# Patient Record
Sex: Male | Born: 1935 | Race: White | Hispanic: No | Smoking: Former smoker
Health system: Southern US, Community
[De-identification: ages and names within clinical notes are randomized; demographics above are authoritative.]

## PROBLEM LIST (undated history)

## (undated) DIAGNOSIS — I10 Essential (primary) hypertension: Secondary | ICD-10-CM

## (undated) DIAGNOSIS — Z950 Presence of cardiac pacemaker: Secondary | ICD-10-CM

## (undated) DIAGNOSIS — C61 Malignant neoplasm of prostate: Secondary | ICD-10-CM

## (undated) DIAGNOSIS — N189 Chronic kidney disease, unspecified: Secondary | ICD-10-CM

## (undated) DIAGNOSIS — Z978 Presence of other specified devices: Secondary | ICD-10-CM

## (undated) DIAGNOSIS — N400 Enlarged prostate without lower urinary tract symptoms: Secondary | ICD-10-CM

## (undated) DIAGNOSIS — C801 Malignant (primary) neoplasm, unspecified: Secondary | ICD-10-CM

## (undated) HISTORY — PX: COLONOSCOPY: SHX174

## (undated) HISTORY — DX: Malignant neoplasm of prostate: C61

## (undated) HISTORY — PX: PACEMAKER IMPLANT: EP1218

## (undated) HISTORY — PX: HERNIA REPAIR: SHX51

## (undated) HISTORY — DX: Chronic kidney disease, unspecified: N18.9

## (undated) HISTORY — DX: Essential (primary) hypertension: I10

## (undated) HISTORY — PX: APPENDECTOMY: SHX54

## (undated) HISTORY — DX: Benign prostatic hyperplasia without lower urinary tract symptoms: N40.0

---

## 2013-03-27 DIAGNOSIS — R079 Chest pain, unspecified: Secondary | ICD-10-CM

## 2013-03-28 DIAGNOSIS — R079 Chest pain, unspecified: Secondary | ICD-10-CM

## 2013-03-28 DIAGNOSIS — I498 Other specified cardiac arrhythmias: Secondary | ICD-10-CM

## 2013-04-24 ENCOUNTER — Encounter: Payer: Self-pay | Admitting: Cardiology

## 2013-04-25 ENCOUNTER — Encounter: Payer: Medicare Other | Admitting: Cardiovascular Disease

## 2013-05-09 ENCOUNTER — Encounter: Payer: Self-pay | Admitting: Cardiovascular Disease

## 2013-05-09 ENCOUNTER — Ambulatory Visit (INDEPENDENT_AMBULATORY_CARE_PROVIDER_SITE_OTHER): Payer: Medicare Other | Admitting: Cardiovascular Disease

## 2013-05-09 VITALS — BP 164/73 | HR 56 | Ht 63.0 in | Wt 154.0 lb

## 2013-05-09 DIAGNOSIS — I10 Essential (primary) hypertension: Secondary | ICD-10-CM

## 2013-05-09 DIAGNOSIS — R079 Chest pain, unspecified: Secondary | ICD-10-CM

## 2013-05-09 DIAGNOSIS — I441 Atrioventricular block, second degree: Secondary | ICD-10-CM

## 2013-05-09 NOTE — Patient Instructions (Signed)
Continue all current medications. Your physician wants you to follow up in:  4 months.  You will receive a reminder letter in the mail one-two months in advance.  If you don't receive a letter, please call our office to schedule the follow up appointment   

## 2013-05-09 NOTE — Progress Notes (Signed)
Patient ID: Austin Jacobs, male   DOB: 17-Apr-1917, 79 y.o.   MRN: [id number]      SUBJECTIVE: Mr. Medcalf is here for f/u after a hospitalization for chest pain at [facility] where I evaluated him. He has known HTN, CKD, and BPH. 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  He denies chest pain, shortness of breath, leg swelling, palpitations, and dizziness. He recently came back after traveling nearly 3000 miles throughout the [place] and [place].  He mows his own lawn with a push mower.    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 He started the [employer] in [place]. Lived in [place] ([place] for nearly 60 years. Served in [place] in [place]. Brother was killed on [address] in [place] (06-30-1940). Has a girlfriend named Armed forces operational officer.    No Known Allergies  Current Outpatient Prescriptions  Medication Sig Dispense Refill  . aliskiren (TEKTURNA) 150 MG tablet Take 150 mg by mouth daily.      Marland Kitchen aspirin 81 MG tablet Take 81 mg by mouth daily.      . hydrochlorothiazide (MICROZIDE) 12.5 MG capsule Take 12.5 mg by mouth daily.      . Multiple Vitamins-Minerals (CENTRUM SILVER PO) Take 1 tablet by mouth daily.      . polyethylene glycol powder (GLYCOLAX/MIRALAX) powder Take 17 g by mouth daily as needed.      . tamsulosin (FLOMAX) 0.4 MG CAPS capsule Take 0.4 mg by mouth daily after supper.       No current facility-administered medications for this visit.    Past Medical History  Diagnosis Date  . Hypertension   . Chronic kidney disease   . BPH (benign prostatic hyperplasia)     Past Surgical History  Procedure Laterality Date  . Appendectomy    . Colonoscopy    . Hernia repair      History   Social History  . Marital Status: Widowed    Spouse Name: N/A    Number of Children: N/A  . Years of Education: N/A   Occupational History  . Not on file.    Social History Main Topics  . Smoking status: Former Smoker -- 0.50 packs/day for 22 years    Types: Cigarettes, Pipe    Start date: 07/26/1935    Quit date: 07/25/1957  . Smokeless tobacco: Never Used  . Alcohol Use: Not on file  . Drug Use: Not on file  . Sexual Activity: Not on file   Other Topics Concern  . Not on file   Social History Narrative  . No narrative on file     Filed Vitals:   05/09/13 1308 05/09/13 1314  BP: 171/70 164/73  Pulse: 64 56  Height: 5\' 3"  (1.6 m)   Weight: 154 lb (69.854 kg)     PHYSICAL EXAM General: NAD Neck: No JVD, no thyromegaly or thyroid nodule.  Lungs: Clear to auscultation bilaterally with normal respiratory effort. CV: Nondisplaced PMI.  Heart regular S1/S2, no S3/S4, no murmur.  No peripheral edema.  No carotid bruit.  Normal pedal pulses.  Abdomen: Soft, nontender, no hepatosplenomegaly, no distention.  Neurologic: Alert and oriented x 3.  Psych: Normal affect. Extremities: No clubbing or cyanosis.   ECG: reviewed and available in electronic records.      ASSESSMENT AND PLAN: 1. Chest pain: no further recurrences. No indication for  noninvasive testing. 2. HTN: he checks his BP at home and tells me his SBP normally runs in the 130-140 mmHg range. He is on Tekturna and HCTZ. 3. Conduction system disease: has bifascicular block and a h/o Mobitz I block while hospitalized. Continue to monitor.   Kate Sable, M.D., F.A.C.C.

## 2013-09-05 ENCOUNTER — Encounter: Payer: Self-pay | Admitting: Cardiovascular Disease

## 2013-09-05 ENCOUNTER — Ambulatory Visit (INDEPENDENT_AMBULATORY_CARE_PROVIDER_SITE_OTHER): Payer: Medicare Other | Admitting: Cardiovascular Disease

## 2013-09-05 VITALS — BP 146/72 | HR 62 | Ht 63.0 in | Wt 157.0 lb

## 2013-09-05 DIAGNOSIS — I441 Atrioventricular block, second degree: Secondary | ICD-10-CM

## 2013-09-05 DIAGNOSIS — R079 Chest pain, unspecified: Secondary | ICD-10-CM

## 2013-09-05 DIAGNOSIS — I10 Essential (primary) hypertension: Secondary | ICD-10-CM

## 2013-09-05 NOTE — Progress Notes (Signed)
Patient ID: IBAAD KHOURY, male   DOB: Jun 17, 1917, 80 y.o.   MRN: [id number]      SUBJECTIVE: Mr. Clemenson is here for routine cardiovascular follow-up. He has known HTN, CKD, and BPH.  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  He denies chest pain, shortness of breath, leg swelling, palpitations, and dizziness.  He visited his son in [place], [place], over Christmas. He and his girlfriend, [name], are planning to go to [place] at the end of this month. He plays cards once a month. He enjoys reading the encyclopedia.  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 He started the [employer] in [place]. Lived in [place] ([place]) for nearly 60 years. Served in [place] in [place]. Brother was killed [place] [address] in [place] (06-30-1940). He worked under [employer] and then retired on 07-25-1976 when [employer] was president.      No Known Allergies  Current Outpatient Prescriptions  Medication Sig Dispense Refill  . aliskiren (TEKTURNA) 150 MG tablet Take 150 mg by mouth daily.      Marland Kitchen amoxicillin (AMOXIL) 500 MG capsule Take 1 capsule by mouth as directed. Prior to dental procedures      . aspirin 81 MG tablet Take 81 mg by mouth daily.      . hydrochlorothiazide (MICROZIDE) 12.5 MG capsule Take 12.5 mg by mouth daily.      . Multiple Vitamins-Minerals (CENTRUM SILVER PO) Take 1 tablet by mouth daily.      . polyethylene glycol powder (GLYCOLAX/MIRALAX) powder Take 17 g by mouth daily as needed.      . tamsulosin (FLOMAX) 0.4 MG CAPS capsule Take 0.4 mg by mouth daily after supper.       No current facility-administered medications for this visit.    Past Medical History  Diagnosis Date  . Hypertension   . Chronic kidney disease   . BPH (benign prostatic hyperplasia)     Past Surgical History  Procedure Laterality Date  . Appendectomy    . Colonoscopy    . Hernia repair       History   Social History  . Marital Status: Widowed    Spouse Name: N/A    Number of Children: N/A  . Years of Education: N/A   Occupational History  . Not on file.   Social History Main Topics  . Smoking status: Former Smoker -- 0.50 packs/day for 22 years    Types: Cigarettes, Pipe    Start date: 07/26/1935    Quit date: 07/25/1957  . Smokeless tobacco: Never Used  . Alcohol Use: Not on file  . Drug Use: Not on file  . Sexual Activity: Not on file   Other Topics Concern  . Not on file   Social History Narrative  . No narrative on file     Filed Vitals:   09/05/13 1002  BP: 146/72  Pulse: 62  Height: 5\' 3"  (1.6 m)  Weight: 157 lb (71.215 kg)  SpO2: 100%    PHYSICAL EXAM General: NAD Neck: No JVD, no thyromegaly or thyroid nodule.  Lungs: Clear to auscultation bilaterally with normal respiratory effort. CV: Nondisplaced PMI.  Heart regular S1/S2, no S3/S4, II/VI ejection systolic murmur over RUSB.  No peripheral edema.  No carotid bruit.  Normal pedal pulses.  Abdomen: Soft, nontender, no hepatosplenomegaly, no distention.  Neurologic: Alert and oriented x 3.  Psych: Normal  affect. Extremities: No clubbing or cyanosis.   ECG: reviewed and available in electronic records.      ASSESSMENT AND PLAN: 1. Chest pain: no further recurrences. No indication for noninvasive testing.  2. HTN: well controlled on current therapy which includes Tekturna and HCTZ.  3. Conduction system disease: has bifascicular block and a h/o Mobitz I block while hospitalized. Continue to monitor.  Dispo: f/u 4 months.  Kate Sable, M.D., F.A.C.C.

## 2013-09-05 NOTE — Patient Instructions (Signed)
Your physician recommends that you schedule a follow-up appointment in: 4 months with Dr. Bronson Ing. This appointment will be scheduled today before you leave.  Your physician recommends that you continue on your current medications as directed. Please refer to the Current Medication list given to you today.

## 2013-12-08 ENCOUNTER — Encounter: Payer: Self-pay | Admitting: Cardiovascular Disease

## 2014-01-03 ENCOUNTER — Encounter: Payer: Self-pay | Admitting: Cardiovascular Disease

## 2014-01-03 ENCOUNTER — Ambulatory Visit (INDEPENDENT_AMBULATORY_CARE_PROVIDER_SITE_OTHER): Payer: Medicare Other | Admitting: Cardiovascular Disease

## 2014-01-03 VITALS — BP 131/72 | HR 74 | Ht 63.0 in | Wt 149.0 lb

## 2014-01-03 DIAGNOSIS — I441 Atrioventricular block, second degree: Secondary | ICD-10-CM

## 2014-01-03 DIAGNOSIS — I10 Essential (primary) hypertension: Secondary | ICD-10-CM

## 2014-01-03 DIAGNOSIS — R079 Chest pain, unspecified: Secondary | ICD-10-CM

## 2014-01-03 NOTE — Progress Notes (Signed)
Patient ID: Austin Jacobs, male   DOB: 26-May-1917, 80 y.o.   MRN: [id number]      SUBJECTIVE: Austin Jacobs is here for routine cardiovascular follow-up. He has known HTN, CKD, and BPH.  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   He denies chest pain, shortness of breath, leg swelling, and palpitations. He may get dizzy if he stands up too quickly. He is planning on going with Suzie to [place] to attend a dinner theater next week. He walks in the mall for 30 minutes a day 5 days a week.  He plays cards once a month. He enjoys reading the encyclopedia.    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 He started the [employer] in [place]. Lived in [place] ([place] for nearly 60 years. Served in [place] in [place]. Brother was killed [place] [address] in [place] (06-30-1940). He worked under [employer] and then retired on 07-25-1976 when E[employer] was president.     No Known Allergies  Current Outpatient Prescriptions  Medication Sig Dispense Refill  . aliskiren (TEKTURNA) 150 MG tablet Take 150 mg by mouth daily.      Marland Kitchen amoxicillin (AMOXIL) 500 MG capsule Take 1 capsule by mouth as directed. Prior to dental procedures      . aspirin 81 MG tablet Take 81 mg by mouth daily.      . hydrochlorothiazide (HYDRODIURIL) 12.5 MG tablet Take 1 tablet by mouth daily.      . Multiple Vitamins-Minerals (CENTRUM SILVER PO) Take 1 tablet by mouth daily.      . polyethylene glycol powder (GLYCOLAX/MIRALAX) powder Take 17 g by mouth daily as needed.      . tamsulosin (FLOMAX) 0.4 MG CAPS capsule Take 0.4 mg by mouth daily after supper.       No current facility-administered medications for this visit.    Past Medical History  Diagnosis Date  . Hypertension   . Chronic kidney disease   . BPH (benign prostatic hyperplasia)     Past Surgical History  Procedure Laterality Date  . Appendectomy    .  Colonoscopy    . Hernia repair      History   Social History  . Marital Status: Widowed    Spouse Name: N/A    Number of Children: N/A  . Years of Education: N/A   Occupational History  . Not on file.   Social History Main Topics  . Smoking status: Former Smoker -- 0.50 packs/day for 22 years    Types: Cigarettes, Pipe    Start date: 07/26/1935    Quit date: 07/25/1957  . Smokeless tobacco: Never Used  . Alcohol Use: Not on file  . Drug Use: Not on file  . Sexual Activity: Not on file   Other Topics Concern  . Not on file   Social History Narrative  . No narrative on file     Filed Vitals:   01/03/14 0951  BP: 131/72  Pulse: 74  Height: 5\' 3"  (1.6 m)  Weight: 149 lb (67.586 kg)  SpO2: 97%    PHYSICAL EXAM General: NAD  Neck: No JVD, no thyromegaly or thyroid nodule.  Lungs: Clear to auscultation bilaterally with normal respiratory effort.  CV: Nondisplaced PMI. Heart regular S1/S2, no S3/S4, II/VI ejection systolic murmur over RUSB. No peripheral edema. No carotid bruit. Normal pedal pulses.  Abdomen: Soft, nontender, no  hepatosplenomegaly, no distention.  Neurologic: Alert and oriented x 3.  Psych: Normal affect.  Extremities: No clubbing or cyanosis.   ECG: reviewed and available in electronic records.      ASSESSMENT AND PLAN: 1. Chest pain: No further recurrences. No indication for noninvasive testing.  2. HTN: Well controlled on current therapy which includes Tekturna and HCTZ.  3. Conduction system disease: Has bifascicular block and a h/o Mobitz I block while hospitalized. Continue to monitor.   Dispo: f/u 4 months.   Kate Sable, M.D., F.A.C.C.

## 2014-01-03 NOTE — Patient Instructions (Signed)
Continue all current medications. Your physician wants you to follow up in:  4 months.  You will receive a reminder letter in the mail one-two months in advance.  If you don't receive a letter, please call our office to schedule the follow up appointment   

## 2014-05-13 ENCOUNTER — Encounter: Payer: Self-pay | Admitting: Cardiovascular Disease

## 2014-05-13 ENCOUNTER — Ambulatory Visit (INDEPENDENT_AMBULATORY_CARE_PROVIDER_SITE_OTHER): Payer: Medicare Other | Admitting: Cardiovascular Disease

## 2014-05-13 VITALS — BP 145/53 | HR 54 | Ht 62.0 in | Wt 152.2 lb

## 2014-05-13 DIAGNOSIS — I10 Essential (primary) hypertension: Secondary | ICD-10-CM

## 2014-05-13 DIAGNOSIS — I453 Trifascicular block: Secondary | ICD-10-CM

## 2014-05-13 DIAGNOSIS — I441 Atrioventricular block, second degree: Secondary | ICD-10-CM

## 2014-05-13 NOTE — Progress Notes (Signed)
Patient ID: Austin Jacobs, male   DOB: 22-Sep-1916, 80 y.o.   MRN: [id number]      SUBJECTIVE: Mr. Theroux is here for routine cardiovascular follow-up. He has known HTN, CKD, and BPH.  He has a h/o Mobitz I AV block and today's ECG shows sinus bradycardia, HR 54 bpm, 1st degree AV block (PR 316 ms), RBBB and LAFB.  He denies chest pain, shortness of breath, leg swelling, and palpitations.  He may get dizzy if he sits up too quickly but this resolves in seconds.  He enjoys spending time with his friend, [name]. They recently returned from a trip to [place], [place]. He walks in the mall for 30 minutes a day 5 days a week.  He plays cards once a month. He enjoys reading the encyclopedia.   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 He started the [employer] in [place]. Lived in [place] ([place]) for nearly 60 years. Served in [place] in [place]. Brother was killed [place] [address] in [place] (06-30-1940). He worked under [employer] and then retired on 07-25-1976 when [employer] was president.       Review of Systems: As per "subjective", otherwise negative.  No Known Allergies  Current Outpatient Prescriptions  Medication Sig Dispense Refill  . aliskiren (TEKTURNA) 150 MG tablet Take 150 mg by mouth daily.      Marland Kitchen amoxicillin (AMOXIL) 500 MG capsule Take 1 capsule by mouth as directed. Prior to dental procedures      . aspirin 81 MG tablet Take 81 mg by mouth daily.      . hydrochlorothiazide (HYDRODIURIL) 12.5 MG tablet Take 1 tablet by mouth daily.      . Multiple Vitamins-Minerals (CENTRUM SILVER PO) Take 1 tablet by mouth daily.      . polyethylene glycol powder (GLYCOLAX/MIRALAX) powder Take 17 g by mouth daily as needed.      . tamsulosin (FLOMAX) 0.4 MG CAPS capsule Take 0.4 mg by mouth daily after breakfast.        No current facility-administered medications for this visit.    Past Medical History  Diagnosis Date  .  Hypertension   . Chronic kidney disease   . BPH (benign prostatic hyperplasia)     Past Surgical History  Procedure Laterality Date  . Appendectomy    . Colonoscopy    . Hernia repair      History   Social History  . Marital Status: Widowed    Spouse Name: N/A    Number of Children: N/A  . Years of Education: N/A   Occupational History  . Not on file.   Social History Main Topics  . Smoking status: Former Smoker -- 0.50 packs/day for 22 years    Types: Cigarettes, Pipe    Start date: 07/26/1935    Quit date: 07/25/1957  . Smokeless tobacco: Never Used  . Alcohol Use: Yes     Comment: Occasionally- few times a month  . Drug Use: No  . Sexual Activity: Not on file   Other Topics Concern  . Not on file   Social History Narrative  . No narrative on file     Filed Vitals:   05/13/14 0810  BP: 145/53  Pulse: 54  Height: 5\' 2"  (1.575 m)  Weight: 152 lb 4 oz (69.06 kg)  SpO2: 100%    PHYSICAL EXAM General: NAD  Neck: No JVD, no thyromegaly or thyroid nodule.  Lungs:  Clear to auscultation bilaterally with normal respiratory effort.  CV: Nondisplaced PMI. Heart regular S1/S2, no S3/S4, I/VI ejection systolic murmur over RUSB. No peripheral edema. No carotid bruit. Normal pedal pulses.  Abdomen: Soft, nontender, no hepatosplenomegaly, no distention.  Neurologic: Alert and oriented x 3.  Psych: Normal affect.  Skin: Normal. Musculoskeletal: Normal range of motion, no gross deformities. Extremities: No clubbing or cyanosis.   ECG: Most recent ECG reviewed.      ASSESSMENT AND PLAN: 1. Chest pain: No further recurrences. No indication for noninvasive testing.  2. Essential HTN: Well controlled for age on current therapy which includes Tekturna and HCTZ. No changes to management. 3. 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 Continue to monitor.   Dispo: f/u 4 months.   Kate Sable, M.D., F.A.C.C.

## 2014-05-13 NOTE — Patient Instructions (Signed)
Continue all current medications. Your physician wants you to follow up in:  4 months.  You will receive a reminder letter in the mail one-two months in advance.  If you don't receive a letter, please call our office to schedule the follow up appointment   

## 2014-09-01 ENCOUNTER — Ambulatory Visit: Payer: Medicare Other | Admitting: Cardiovascular Disease

## 2014-09-02 ENCOUNTER — Encounter: Payer: Self-pay | Admitting: Cardiovascular Disease

## 2014-09-02 ENCOUNTER — Ambulatory Visit (INDEPENDENT_AMBULATORY_CARE_PROVIDER_SITE_OTHER): Payer: Medicare Other | Admitting: Cardiovascular Disease

## 2014-09-02 VITALS — BP 120/58 | HR 62 | Ht 62.0 in | Wt 155.0 lb

## 2014-09-02 DIAGNOSIS — I10 Essential (primary) hypertension: Secondary | ICD-10-CM

## 2014-09-02 DIAGNOSIS — I453 Trifascicular block: Secondary | ICD-10-CM

## 2014-09-02 DIAGNOSIS — I441 Atrioventricular block, second degree: Secondary | ICD-10-CM

## 2014-09-02 NOTE — Progress Notes (Signed)
Patient ID: Austin Jacobs, male   DOB: [birth year], 81 y.o.   MRN: [id number]      SUBJECTIVE: Austin Jacobs is here for routine cardiovascular follow-up. He has known HTN, CKD, and BPH.  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  He denies chest pain, shortness of breath, leg swelling, and palpitations.   He enjoys spending time with his second wife, [name]. They traveled to [facility] over Christmas and are planning to go to [place] ([place]) later this month. He walks in the mall for 30 minutes a day 5 days a week, approximately 2 miles daily.  He plays cards once a month. He enjoys reading the encyclopedia.  He is very involved with his church.  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 He started the [employer] in [place]. Lived in [place] ([place]) for nearly 60 years. Served in [place] in [place]. Brother was killed on [address] in [place] (06-30-1940). He worked under [employer] and then retired on 07-25-1976 when [employer] was president. 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    Review of Systems: As per "subjective", otherwise negative.  No Known Allergies  Current Outpatient Prescriptions  Medication Sig Dispense Refill  . aliskiren (TEKTURNA) 150 MG tablet Take 150 mg by mouth daily.    Marland Kitchen amoxicillin (AMOXIL) 500 MG capsule Take 1 capsule by mouth as directed. Prior to dental procedures    . aspirin 81 MG tablet Take 81 mg by mouth daily.    . hydrochlorothiazide (HYDRODIURIL) 12.5 MG tablet Take 1 tablet by mouth daily.    . Multiple Vitamins-Minerals (CENTRUM SILVER PO) Take 1 tablet by mouth daily.    . polyethylene glycol powder (GLYCOLAX/MIRALAX) powder Take 17 g by mouth daily as needed.    . tamsulosin (FLOMAX) 0.4 MG CAPS capsule Take 0.4 mg by mouth daily after breakfast.      No current facility-administered medications for this visit.    Past Medical  History  Diagnosis Date  . Hypertension   . Chronic kidney disease   . BPH (benign prostatic hyperplasia)     Past Surgical History  Procedure Laterality Date  . Appendectomy    . Colonoscopy    . Hernia repair      History   Social History  . Marital Status: Widowed    Spouse Name: N/A    Number of Children: N/A  . Years of Education: N/A   Occupational History  . Not on file.   Social History Main Topics  . Smoking status: Former Smoker -- 0.50 packs/day for 22 years    Types: Cigarettes, Pipe    Start date: 07/26/1935    Quit date: 07/25/1957  . Smokeless tobacco: Never Used  . Alcohol Use: Yes     Comment: Occasionally- few times a month  . Drug Use: No  . Sexual Activity: Not on file   Other Topics Concern  . Not on file   Social History Narrative  . No narrative on file    BP 120/58  Pulse 62 SpO2 98%  Weight 155 lb (70.308 kg) Height 5\' 2"  (1.575 m)    PHYSICAL EXAM General: NAD  Neck: No JVD, no thyromegaly or thyroid nodule.  Lungs: Clear to auscultation bilaterally with normal respiratory effort.  CV: Nondisplaced PMI. Heart regular S1/S2, no S3/S4, I/VI ejection systolic murmur over RUSB.  No peripheral edema. No carotid bruit. Normal pedal pulses.  Abdomen: Soft, nontender, no hepatosplenomegaly, no distention.  Neurologic: Alert and oriented x 3.  Psych: Normal affect.  Skin: Normal. Musculoskeletal: Normal range of motion, no gross deformities. Extremities: No clubbing or cyanosis.   ECG: Most recent ECG reviewed.    ASSESSMENT AND PLAN: 1. Chest pain: No further recurrences. No indication for noninvasive testing.  2. Essential HTN: Well controlled on current therapy which includes Tekturna and HCTZ. No changes to management. 3. 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 Continue to monitor.   Dispo: f/u 6 months.   Kate Sable, M.D.,  F.A.C.C.

## 2014-09-02 NOTE — Patient Instructions (Signed)
Continue all current medications. Your physician wants you to follow up in: 6 months.  You will receive a reminder letter in the mail one-two months in advance.  If you don't receive a letter, please call our office to schedule the follow up appointment   

## 2015-03-12 ENCOUNTER — Encounter: Payer: Self-pay | Admitting: Cardiovascular Disease

## 2015-03-12 ENCOUNTER — Ambulatory Visit (INDEPENDENT_AMBULATORY_CARE_PROVIDER_SITE_OTHER): Payer: Medicare Other | Admitting: Cardiovascular Disease

## 2015-03-12 VITALS — BP 132/68 | HR 55 | Ht 62.0 in | Wt 151.0 lb

## 2015-03-12 DIAGNOSIS — R079 Chest pain, unspecified: Secondary | ICD-10-CM | POA: Diagnosis not present

## 2015-03-12 DIAGNOSIS — I10 Essential (primary) hypertension: Secondary | ICD-10-CM

## 2015-03-12 DIAGNOSIS — I453 Trifascicular block: Secondary | ICD-10-CM

## 2015-03-12 DIAGNOSIS — I441 Atrioventricular block, second degree: Secondary | ICD-10-CM

## 2015-03-12 NOTE — Patient Instructions (Signed)
Continue all current medications. Your physician wants you to follow up in: 6 months.  You will receive a reminder letter in the mail one-two months in advance.  If you don't receive a letter, please call our office to schedule the follow up appointment   

## 2015-03-12 NOTE — Progress Notes (Signed)
Patient ID: Austin Jacobs, male   DOB: [birth year], 81 y.o.   MRN: [id number]      SUBJECTIVE: Mr. Casola is here for routine cardiovascular follow-up. He has known HTN, CKD, and BPH.  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  He denies chest pain, shortness of breath, leg swelling, and palpitations.  Says he has not had chest pain in over a year.  He enjoys spending time with his second wife, [name]. He walks in the mall for 30 minutes a day 5 days a week, approximately 2 miles daily.  He plays cards once a month. He enjoys reading the encyclopedia.  He is very involved with his church.  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 Lived in [place] ([place]) for nearly 60 years. Served in [place] in [place]. Brother was killed on [address] in [place] (06-30-1940). He worked under [employer] and then retired on 07-25-1976 when [employer] was president. 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   Review of Systems: As per "subjective", otherwise negative.  No Known Allergies  Current Outpatient Prescriptions  Medication Sig Dispense Refill  . aliskiren (TEKTURNA) 150 MG tablet Take 150 mg by mouth daily.    Marland Kitchen amoxicillin (AMOXIL) 500 MG capsule Take 2,000 mg by mouth as directed. One hour prior to dental appointments    . aspirin 81 MG tablet Take 81 mg by mouth daily.    . finasteride (PROSCAR) 5 MG tablet Take 1 tablet by mouth daily.    . hydrochlorothiazide (HYDRODIURIL) 12.5 MG tablet Take 1 tablet by mouth daily.    . Multiple Vitamins-Minerals (CENTRUM SILVER PO) Take 1 tablet by mouth daily.    . polyethylene glycol powder (GLYCOLAX/MIRALAX) powder Take 17 g by mouth daily as needed.    . tamsulosin (FLOMAX) 0.4 MG CAPS capsule Take 0.4 mg by mouth daily after breakfast.      No current  facility-administered medications for this visit.    Past Medical History  Diagnosis Date  . Hypertension   . Chronic kidney disease   . BPH (benign prostatic hyperplasia)     Past Surgical History  Procedure Laterality Date  . Appendectomy    . Colonoscopy    . Hernia repair      Social History   Social History  . Marital Status: Widowed    Spouse Name: N/A  . Number of Children: N/A  . Years of Education: N/A   Occupational History  . Not on file.   Social History Main Topics  . Smoking status: Former Smoker -- 0.50 packs/day for 22 years    Types: Cigarettes, Pipe    Start date: 07/26/1935    Quit date: 07/25/1957  . Smokeless tobacco: Never Used  . Alcohol Use: 0.0 oz/week    0 Standard drinks or equivalent per week     Comment: Occasionally- few times a month  . Drug Use: No  . Sexual Activity: Not on file   Other Topics Concern  . Not on file   Social History Narrative     Filed Vitals:   03/12/15 1112  BP: 132/68  Pulse: 55  Height: 5\' 2"  (1.575 m)  Weight: 151 lb (68.493 kg)    PHYSICAL EXAM General: NAD  Neck: No JVD, no thyromegaly or thyroid nodule.  Lungs: Clear to auscultation bilaterally with normal respiratory effort.  CV: Nondisplaced PMI. Heart regular S1/S2, no S3/S4, I/VI ejection systolic murmur over RUSB. No peripheral edema.   Abdomen: Soft,  no distention.  Neurologic: Alert and oriented x 3.  Psych: Normal affect.  Skin: Normal. Musculoskeletal: Normal range of motion, no gross deformities. Extremities: No clubbing or cyanosis.   ECG: Most recent ECG reviewed.    ASSESSMENT AND PLAN: 1. Chest pain: No further recurrences. No indication for noninvasive testing.   2. Essential HTN: Well controlled on current therapy which includes Tekturna and HCTZ. No changes to management.  3. 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 Continue to  monitor.   Dispo: f/u 6 months.   Kate Sable, M.D., F.A.C.C.

## 2015-07-29 DIAGNOSIS — H2513 Age-related nuclear cataract, bilateral: Secondary | ICD-10-CM | POA: Diagnosis not present

## 2015-07-29 DIAGNOSIS — H538 Other visual disturbances: Secondary | ICD-10-CM | POA: Diagnosis not present

## 2015-08-13 DIAGNOSIS — Z6827 Body mass index (BMI) 27.0-27.9, adult: Secondary | ICD-10-CM | POA: Diagnosis not present

## 2015-08-13 DIAGNOSIS — I10 Essential (primary) hypertension: Secondary | ICD-10-CM | POA: Diagnosis not present

## 2015-08-13 DIAGNOSIS — M1990 Unspecified osteoarthritis, unspecified site: Secondary | ICD-10-CM | POA: Diagnosis not present

## 2015-08-13 DIAGNOSIS — Z789 Other specified health status: Secondary | ICD-10-CM | POA: Diagnosis not present

## 2015-09-10 ENCOUNTER — Ambulatory Visit: Payer: Medicare Other | Admitting: Cardiology

## 2015-09-22 ENCOUNTER — Encounter: Payer: Self-pay | Admitting: Cardiovascular Disease

## 2015-09-22 ENCOUNTER — Ambulatory Visit (INDEPENDENT_AMBULATORY_CARE_PROVIDER_SITE_OTHER): Payer: Medicare Other | Admitting: Cardiovascular Disease

## 2015-09-22 VITALS — BP 142/60 | HR 62 | Ht 62.0 in | Wt 157.0 lb

## 2015-09-22 DIAGNOSIS — R079 Chest pain, unspecified: Secondary | ICD-10-CM | POA: Diagnosis not present

## 2015-09-22 DIAGNOSIS — I10 Essential (primary) hypertension: Secondary | ICD-10-CM

## 2015-09-22 DIAGNOSIS — I441 Atrioventricular block, second degree: Secondary | ICD-10-CM | POA: Diagnosis not present

## 2015-09-22 DIAGNOSIS — I453 Trifascicular block: Secondary | ICD-10-CM

## 2015-09-22 NOTE — Progress Notes (Signed)
Patient ID: IDO GLASSON, male   DOB: 06/12/1917, 82 y.o.   MRN: [id number]      SUBJECTIVE: Austin Jacobs is here for routine cardiovascular follow-up. He has known HTN, CKD, and BPH.  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  He denies chest pain, shortness of breath, leg swelling, and palpitations.  Says he doesn't do as much as he used to.  He enjoys spending time with his second wife, Austin Jacobs. He walks in the mall for 30 minutes a day 5 days a week, approximately 2 miles daily.  He plays cards once a month. He enjoys reading the encyclopedia.  He is very involved with his church.   ECG performed in the office today demonstrates sinus bradycardia, heart rate 58 bpm, marked first-degree AV block, PR interval 416 ms, and right bundle branch block.  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 Lived in [place] ([place]) for nearly 60 years. Served in [place] in [place]. Brother was killed on [place] in [place] (06-30-1940). 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   Review of Systems: As per "subjective", otherwise negative.  No Known Allergies  Current Outpatient Prescriptions  Medication Sig Dispense Refill  . aliskiren (TEKTURNA) 150 MG tablet Take 150 mg by mouth daily.    Marland Kitchen amoxicillin (AMOXIL) 500 MG capsule Take 2,000 mg by mouth as directed. One hour prior to dental appointments    . aspirin 81 MG tablet Take 81 mg by mouth daily.    . finasteride (PROSCAR) 5 MG tablet Take 1 tablet by mouth daily.    . hydrochlorothiazide (HYDRODIURIL) 12.5 MG tablet Take 1 tablet by mouth daily.    . Multiple Vitamins-Minerals (CENTRUM SILVER PO) Take 1 tablet by mouth daily.    . polyethylene glycol powder (GLYCOLAX/MIRALAX)  powder Take 17 g by mouth daily as needed.    . tamsulosin (FLOMAX) 0.4 MG CAPS capsule Take 0.4 mg by mouth daily after breakfast.      No current facility-administered medications for this visit.    Past Medical History  Diagnosis Date  . Hypertension   . Chronic kidney disease   . BPH (benign prostatic hyperplasia)     Past Surgical History  Procedure Laterality Date  . Appendectomy    . Colonoscopy    . Hernia repair      Social History   Social History  . Marital Status: Widowed    Spouse Name: N/A  . Number of Children: N/A  . Years of Education: N/A   Occupational History  . Not on file.   Social History Main Topics  . Smoking status: Former Smoker -- 0.50 packs/day for 22 years    Types: Cigarettes, Pipe    Start date: 07/26/1935    Quit date: 07/25/1957  . Smokeless tobacco: Never Used  . Alcohol Use: 0.0 oz/week    0 Standard drinks or equivalent per week     Comment: Occasionally- few times a month  . Drug Use: No  . Sexual Activity: Not on file   Other Topics Concern  . Not on file   Social History Narrative     Filed Vitals:   09/22/15 1601  BP: 142/60  Pulse: 62  Height: 5\' 2"  (1.575 m)  Weight: 157 lb (71.215 kg)  SpO2: 99%    PHYSICAL EXAM General: NAD  Neck: No JVD, no thyromegaly or thyroid nodule.  Lungs: Clear to auscultation bilaterally with normal respiratory effort.  CV: Nondisplaced PMI. Heart regular S1/S2, no S3/S4, I/VI ejection systolic murmur over RUSB. No peripheral edema.  Abdomen: Soft, no distention.  Neurologic: Alert and oriented x 3.  Psych: Normal affect.  Skin: Normal. Musculoskeletal: Normal range of motion, no gross deformities. Extremities: No clubbing or cyanosis.   ECG: Most recent ECG reviewed.      ASSESSMENT AND PLAN: 1. Chest pain: No significant recurrences. No indication for noninvasive testing.   2. Essential HTN: Well controlled on current therapy which includes Tekturna and  HCTZ. No changes to management.  3. 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 Continue to monitor.   Dispo: f/u 6 months.   Kate Sable, M.D., F.A.C.C.

## 2015-09-22 NOTE — Patient Instructions (Signed)
Your physician wants you to follow-up in: 6 months with Dr. Koneswaran. You will receive a reminder letter in the mail two months in advance. If you don't receive a letter, please call our office to schedule the follow-up appointment.  Your physician recommends that you continue on your current medications as directed. Please refer to the Current Medication list given to you today.  Thank you for choosing [facility]!   

## 2015-09-29 DIAGNOSIS — I739 Peripheral vascular disease, unspecified: Secondary | ICD-10-CM | POA: Diagnosis not present

## 2015-09-29 DIAGNOSIS — L600 Ingrowing nail: Secondary | ICD-10-CM | POA: Diagnosis not present

## 2015-09-29 DIAGNOSIS — L03032 Cellulitis of left toe: Secondary | ICD-10-CM | POA: Diagnosis not present

## 2015-09-29 DIAGNOSIS — L03031 Cellulitis of right toe: Secondary | ICD-10-CM | POA: Diagnosis not present

## 2015-10-15 DIAGNOSIS — I129 Hypertensive chronic kidney disease with stage 1 through stage 4 chronic kidney disease, or unspecified chronic kidney disease: Secondary | ICD-10-CM | POA: Diagnosis not present

## 2015-10-15 DIAGNOSIS — Z7982 Long term (current) use of aspirin: Secondary | ICD-10-CM | POA: Diagnosis not present

## 2015-10-15 DIAGNOSIS — N289 Disorder of kidney and ureter, unspecified: Secondary | ICD-10-CM | POA: Diagnosis not present

## 2015-10-15 DIAGNOSIS — I441 Atrioventricular block, second degree: Secondary | ICD-10-CM | POA: Diagnosis not present

## 2015-10-15 DIAGNOSIS — Z79899 Other long term (current) drug therapy: Secondary | ICD-10-CM | POA: Diagnosis not present

## 2015-10-15 DIAGNOSIS — R1084 Generalized abdominal pain: Secondary | ICD-10-CM | POA: Diagnosis not present

## 2015-10-15 DIAGNOSIS — N201 Calculus of ureter: Secondary | ICD-10-CM | POA: Diagnosis not present

## 2015-10-15 DIAGNOSIS — R109 Unspecified abdominal pain: Secondary | ICD-10-CM | POA: Diagnosis not present

## 2015-10-15 DIAGNOSIS — I519 Heart disease, unspecified: Secondary | ICD-10-CM | POA: Diagnosis not present

## 2015-10-15 DIAGNOSIS — Z887 Allergy status to serum and vaccine status: Secondary | ICD-10-CM | POA: Diagnosis not present

## 2015-10-15 DIAGNOSIS — N183 Chronic kidney disease, stage 3 (moderate): Secondary | ICD-10-CM | POA: Diagnosis not present

## 2015-10-15 DIAGNOSIS — F17201 Nicotine dependence, unspecified, in remission: Secondary | ICD-10-CM | POA: Diagnosis not present

## 2015-10-16 DIAGNOSIS — N183 Chronic kidney disease, stage 3 (moderate): Secondary | ICD-10-CM | POA: Diagnosis not present

## 2015-10-16 DIAGNOSIS — Z7982 Long term (current) use of aspirin: Secondary | ICD-10-CM | POA: Diagnosis not present

## 2015-10-16 DIAGNOSIS — I129 Hypertensive chronic kidney disease with stage 1 through stage 4 chronic kidney disease, or unspecified chronic kidney disease: Secondary | ICD-10-CM | POA: Diagnosis not present

## 2015-10-16 DIAGNOSIS — N201 Calculus of ureter: Secondary | ICD-10-CM | POA: Diagnosis not present

## 2015-10-16 DIAGNOSIS — Z79899 Other long term (current) drug therapy: Secondary | ICD-10-CM | POA: Diagnosis not present

## 2015-10-16 DIAGNOSIS — I441 Atrioventricular block, second degree: Secondary | ICD-10-CM | POA: Diagnosis not present

## 2015-10-16 DIAGNOSIS — Z887 Allergy status to serum and vaccine status: Secondary | ICD-10-CM | POA: Diagnosis not present

## 2015-10-16 DIAGNOSIS — I10 Essential (primary) hypertension: Secondary | ICD-10-CM | POA: Diagnosis not present

## 2015-10-16 DIAGNOSIS — F17201 Nicotine dependence, unspecified, in remission: Secondary | ICD-10-CM | POA: Diagnosis not present

## 2015-10-17 DIAGNOSIS — I129 Hypertensive chronic kidney disease with stage 1 through stage 4 chronic kidney disease, or unspecified chronic kidney disease: Secondary | ICD-10-CM | POA: Diagnosis not present

## 2015-10-17 DIAGNOSIS — N201 Calculus of ureter: Secondary | ICD-10-CM | POA: Diagnosis not present

## 2015-10-17 DIAGNOSIS — I441 Atrioventricular block, second degree: Secondary | ICD-10-CM | POA: Diagnosis not present

## 2015-10-17 DIAGNOSIS — N183 Chronic kidney disease, stage 3 (moderate): Secondary | ICD-10-CM | POA: Diagnosis not present

## 2015-10-18 DIAGNOSIS — N183 Chronic kidney disease, stage 3 (moderate): Secondary | ICD-10-CM | POA: Diagnosis not present

## 2015-10-18 DIAGNOSIS — I441 Atrioventricular block, second degree: Secondary | ICD-10-CM | POA: Diagnosis not present

## 2015-10-18 DIAGNOSIS — N201 Calculus of ureter: Secondary | ICD-10-CM | POA: Diagnosis not present

## 2015-10-18 DIAGNOSIS — I129 Hypertensive chronic kidney disease with stage 1 through stage 4 chronic kidney disease, or unspecified chronic kidney disease: Secondary | ICD-10-CM | POA: Diagnosis not present

## 2015-10-22 DIAGNOSIS — I10 Essential (primary) hypertension: Secondary | ICD-10-CM | POA: Diagnosis not present

## 2015-10-22 DIAGNOSIS — Z887 Allergy status to serum and vaccine status: Secondary | ICD-10-CM | POA: Diagnosis not present

## 2015-10-22 DIAGNOSIS — Z87891 Personal history of nicotine dependence: Secondary | ICD-10-CM | POA: Diagnosis not present

## 2015-10-22 DIAGNOSIS — I441 Atrioventricular block, second degree: Secondary | ICD-10-CM | POA: Diagnosis not present

## 2015-10-22 DIAGNOSIS — I129 Hypertensive chronic kidney disease with stage 1 through stage 4 chronic kidney disease, or unspecified chronic kidney disease: Secondary | ICD-10-CM | POA: Diagnosis not present

## 2015-10-22 DIAGNOSIS — Z466 Encounter for fitting and adjustment of urinary device: Secondary | ICD-10-CM | POA: Diagnosis not present

## 2015-10-22 DIAGNOSIS — K219 Gastro-esophageal reflux disease without esophagitis: Secondary | ICD-10-CM | POA: Diagnosis not present

## 2015-10-22 DIAGNOSIS — Z87442 Personal history of urinary calculi: Secondary | ICD-10-CM | POA: Diagnosis not present

## 2015-10-22 DIAGNOSIS — N189 Chronic kidney disease, unspecified: Secondary | ICD-10-CM | POA: Diagnosis not present

## 2015-10-22 DIAGNOSIS — N201 Calculus of ureter: Secondary | ICD-10-CM | POA: Diagnosis not present

## 2015-10-22 DIAGNOSIS — N400 Enlarged prostate without lower urinary tract symptoms: Secondary | ICD-10-CM | POA: Diagnosis not present

## 2015-10-24 DIAGNOSIS — Z7982 Long term (current) use of aspirin: Secondary | ICD-10-CM | POA: Diagnosis not present

## 2015-10-24 DIAGNOSIS — Z79899 Other long term (current) drug therapy: Secondary | ICD-10-CM | POA: Diagnosis not present

## 2015-10-24 DIAGNOSIS — I10 Essential (primary) hypertension: Secondary | ICD-10-CM | POA: Diagnosis not present

## 2015-10-24 DIAGNOSIS — K5900 Constipation, unspecified: Secondary | ICD-10-CM | POA: Diagnosis not present

## 2015-10-24 DIAGNOSIS — Z87442 Personal history of urinary calculi: Secondary | ICD-10-CM | POA: Diagnosis not present

## 2015-10-24 DIAGNOSIS — Z87891 Personal history of nicotine dependence: Secondary | ICD-10-CM | POA: Diagnosis not present

## 2015-10-24 DIAGNOSIS — K5903 Drug induced constipation: Secondary | ICD-10-CM | POA: Diagnosis not present

## 2015-10-27 DIAGNOSIS — K5900 Constipation, unspecified: Secondary | ICD-10-CM | POA: Diagnosis not present

## 2015-10-27 DIAGNOSIS — N200 Calculus of kidney: Secondary | ICD-10-CM | POA: Diagnosis not present

## 2015-10-27 DIAGNOSIS — I10 Essential (primary) hypertension: Secondary | ICD-10-CM | POA: Diagnosis not present

## 2015-10-27 DIAGNOSIS — N184 Chronic kidney disease, stage 4 (severe): Secondary | ICD-10-CM | POA: Diagnosis not present

## 2015-11-16 DIAGNOSIS — N201 Calculus of ureter: Secondary | ICD-10-CM | POA: Diagnosis not present

## 2015-11-16 DIAGNOSIS — N200 Calculus of kidney: Secondary | ICD-10-CM | POA: Diagnosis not present

## 2015-12-29 DIAGNOSIS — L03032 Cellulitis of left toe: Secondary | ICD-10-CM | POA: Diagnosis not present

## 2015-12-29 DIAGNOSIS — I739 Peripheral vascular disease, unspecified: Secondary | ICD-10-CM | POA: Diagnosis not present

## 2015-12-29 DIAGNOSIS — L600 Ingrowing nail: Secondary | ICD-10-CM | POA: Diagnosis not present

## 2015-12-29 DIAGNOSIS — L03031 Cellulitis of right toe: Secondary | ICD-10-CM | POA: Diagnosis not present

## 2016-01-19 DIAGNOSIS — H538 Other visual disturbances: Secondary | ICD-10-CM | POA: Diagnosis not present

## 2016-01-19 DIAGNOSIS — H2512 Age-related nuclear cataract, left eye: Secondary | ICD-10-CM | POA: Diagnosis not present

## 2016-02-12 DIAGNOSIS — Z6826 Body mass index (BMI) 26.0-26.9, adult: Secondary | ICD-10-CM | POA: Diagnosis not present

## 2016-02-12 DIAGNOSIS — I10 Essential (primary) hypertension: Secondary | ICD-10-CM | POA: Diagnosis not present

## 2016-02-12 DIAGNOSIS — Z1211 Encounter for screening for malignant neoplasm of colon: Secondary | ICD-10-CM | POA: Diagnosis not present

## 2016-02-12 DIAGNOSIS — Z0000 Encounter for general adult medical examination without abnormal findings: Secondary | ICD-10-CM | POA: Diagnosis not present

## 2016-02-12 DIAGNOSIS — Z1389 Encounter for screening for other disorder: Secondary | ICD-10-CM | POA: Diagnosis not present

## 2016-02-12 DIAGNOSIS — Z299 Encounter for prophylactic measures, unspecified: Secondary | ICD-10-CM | POA: Diagnosis not present

## 2016-02-12 DIAGNOSIS — Z7189 Other specified counseling: Secondary | ICD-10-CM | POA: Diagnosis not present

## 2016-02-12 DIAGNOSIS — R5383 Other fatigue: Secondary | ICD-10-CM | POA: Diagnosis not present

## 2016-02-12 DIAGNOSIS — Z125 Encounter for screening for malignant neoplasm of prostate: Secondary | ICD-10-CM | POA: Diagnosis not present

## 2016-02-12 DIAGNOSIS — Z79899 Other long term (current) drug therapy: Secondary | ICD-10-CM | POA: Diagnosis not present

## 2016-02-15 DIAGNOSIS — M159 Polyosteoarthritis, unspecified: Secondary | ICD-10-CM | POA: Diagnosis not present

## 2016-02-15 DIAGNOSIS — I10 Essential (primary) hypertension: Secondary | ICD-10-CM | POA: Diagnosis not present

## 2016-03-02 ENCOUNTER — Encounter: Payer: Self-pay | Admitting: Cardiovascular Disease

## 2016-03-02 ENCOUNTER — Encounter (INDEPENDENT_AMBULATORY_CARE_PROVIDER_SITE_OTHER): Payer: Self-pay

## 2016-03-02 ENCOUNTER — Ambulatory Visit (INDEPENDENT_AMBULATORY_CARE_PROVIDER_SITE_OTHER): Payer: Medicare Other | Admitting: Cardiovascular Disease

## 2016-03-02 VITALS — BP 130/58 | HR 54 | Ht 61.0 in | Wt 150.0 lb

## 2016-03-02 DIAGNOSIS — I453 Trifascicular block: Secondary | ICD-10-CM | POA: Diagnosis not present

## 2016-03-02 DIAGNOSIS — I10 Essential (primary) hypertension: Secondary | ICD-10-CM

## 2016-03-02 DIAGNOSIS — I441 Atrioventricular block, second degree: Secondary | ICD-10-CM | POA: Diagnosis not present

## 2016-03-02 NOTE — Progress Notes (Signed)
SUBJECTIVE: Austin Jacobs is here for routine cardiovascular follow-up. He has known HTN, CKD, and BPH.  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  He denies chest pain, shortness of breath, leg swelling, and palpitations.   He enjoys spending time with his second wife, Austin Jacobs. He walks in the mall for 30 minutes a day 5 days a week, approximately 2 miles daily.  He plays cards once a month. He enjoys reading the encyclopedia.  He is very involved with his church.  He had a kidney stone in March. That was his only hospitalization.  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 Lived in [place] ([place]) for nearly 60 years. Served in [place] in [place]. Brother was killed [place] [address] in [place] (06-30-1940). 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   Review of Systems: As per "subjective", otherwise negative.  No Known Allergies  Current Outpatient Prescriptions  Medication Sig Dispense Refill  . aliskiren (TEKTURNA) 150 MG tablet Take 150 mg by mouth daily.    Marland Kitchen amoxicillin (AMOXIL) 500 MG capsule Take 2,000 mg by mouth as directed. One hour prior to dental appointments    . aspirin 81 MG tablet Take 81 mg by mouth daily.    . finasteride (PROSCAR) 5 MG tablet Take 1 tablet by mouth daily.    . hydrochlorothiazide (HYDRODIURIL) 12.5 MG tablet Take 1 tablet by mouth daily.    . Multiple Vitamins-Minerals (CENTRUM SILVER PO) Take 1 tablet by mouth daily.    . polyethylene glycol powder (GLYCOLAX/MIRALAX) powder Take 17 g by mouth daily as needed.    . tamsulosin (FLOMAX) 0.4 MG CAPS capsule Take 0.4 mg by mouth daily after breakfast.      No current facility-administered medications for this visit.     Past Medical  History:  Diagnosis Date  . BPH (benign prostatic hyperplasia)   . Chronic kidney disease   . Hypertension     Past Surgical History:  Procedure Laterality Date  . APPENDECTOMY    . COLONOSCOPY    . HERNIA REPAIR      Social History   Social History  . Marital status: Widowed    Spouse name: N/A  . Number of children: N/A  . Years of education: N/A   Occupational History  . Not on file.   Social History Main Topics  . Smoking status: Former Smoker    Packs/day: 0.50    Years: 22.00    Types: Cigarettes, Pipe    Start date: 07/26/1935    Quit date: 07/25/1957  . Smokeless tobacco: Never Used  . Alcohol use 0.0 oz/week     Comment: Occasionally- few times a month  . Drug use: No  . Sexual activity: Not on file   Other Topics Concern  . Not on file   Social History Narrative  . No narrative on file     Vitals:   03/02/16 1110  BP: (!) 130/58  Pulse: (!) 54  SpO2: 97%  Weight: 150 lb (68 kg)  Height: 5\' 1"  (1.549 m)    PHYSICAL EXAM General: NAD  Neck: No JVD, no thyromegaly or thyroid nodule.  Lungs: Clear to auscultation bilaterally with normal respiratory effort.  CV: Nondisplaced PMI. Bradycardic, regular rhythm, normal S1/S2, no S3/S4, I/VI ejection systolic murmur over RUSB. No peripheral edema.  Abdomen: Soft, no distention.  Neurologic: Alert and oriented x 3.  Psych: Normal affect.  Skin: Normal. Musculoskeletal: No gross deformities. Extremities: No clubbing or cyanosis.     ECG: Most recent ECG reviewed.      ASSESSMENT AND PLAN: 1. Chest pain: No significant recurrences. No indication for noninvasive testing.   2. Essential HTN: Well controlled on current therapy which includes Tekturna and HCTZ. No changes to management.  3. 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 Continue to monitor.   Dispo: f/u 6 months.   Kate Sable, M.D.,  F.A.C.C.

## 2016-03-02 NOTE — Patient Instructions (Signed)
Medication Instructions:  Continue all current medications.  Labwork: None.  Testing/Procedures: None.  Follow-Up: Your physician wants you to follow up in: 6 months.  You will receive a reminder letter in the mail one-two months in advance.  If you don't receive a letter, please call our office to schedule the follow up appointment   Any Other Special Instructions Will Be Listed Below (If Applicable).  If you need a refill on your cardiac medications before your next appointment, please call your pharmacy.

## 2016-03-11 DIAGNOSIS — I10 Essential (primary) hypertension: Secondary | ICD-10-CM | POA: Diagnosis not present

## 2016-03-11 DIAGNOSIS — M159 Polyosteoarthritis, unspecified: Secondary | ICD-10-CM | POA: Diagnosis not present

## 2016-03-15 DIAGNOSIS — L03031 Cellulitis of right toe: Secondary | ICD-10-CM | POA: Diagnosis not present

## 2016-03-15 DIAGNOSIS — L03032 Cellulitis of left toe: Secondary | ICD-10-CM | POA: Diagnosis not present

## 2016-03-15 DIAGNOSIS — L600 Ingrowing nail: Secondary | ICD-10-CM | POA: Diagnosis not present

## 2016-03-15 DIAGNOSIS — I739 Peripheral vascular disease, unspecified: Secondary | ICD-10-CM | POA: Diagnosis not present

## 2016-04-08 DIAGNOSIS — M159 Polyosteoarthritis, unspecified: Secondary | ICD-10-CM | POA: Diagnosis not present

## 2016-04-08 DIAGNOSIS — I10 Essential (primary) hypertension: Secondary | ICD-10-CM | POA: Diagnosis not present

## 2016-05-04 DIAGNOSIS — Z23 Encounter for immunization: Secondary | ICD-10-CM | POA: Diagnosis not present

## 2016-05-11 DIAGNOSIS — M159 Polyosteoarthritis, unspecified: Secondary | ICD-10-CM | POA: Diagnosis not present

## 2016-05-11 DIAGNOSIS — I10 Essential (primary) hypertension: Secondary | ICD-10-CM | POA: Diagnosis not present

## 2016-05-20 DIAGNOSIS — Z6827 Body mass index (BMI) 27.0-27.9, adult: Secondary | ICD-10-CM | POA: Diagnosis not present

## 2016-05-20 DIAGNOSIS — Z713 Dietary counseling and surveillance: Secondary | ICD-10-CM | POA: Diagnosis not present

## 2016-05-20 DIAGNOSIS — I10 Essential (primary) hypertension: Secondary | ICD-10-CM | POA: Diagnosis not present

## 2016-05-20 DIAGNOSIS — N184 Chronic kidney disease, stage 4 (severe): Secondary | ICD-10-CM | POA: Diagnosis not present

## 2016-05-20 DIAGNOSIS — Z299 Encounter for prophylactic measures, unspecified: Secondary | ICD-10-CM | POA: Diagnosis not present

## 2016-05-30 DIAGNOSIS — M159 Polyosteoarthritis, unspecified: Secondary | ICD-10-CM | POA: Diagnosis not present

## 2016-05-30 DIAGNOSIS — I10 Essential (primary) hypertension: Secondary | ICD-10-CM | POA: Diagnosis not present

## 2016-06-17 DIAGNOSIS — L03115 Cellulitis of right lower limb: Secondary | ICD-10-CM | POA: Diagnosis not present

## 2016-06-18 DIAGNOSIS — Z7982 Long term (current) use of aspirin: Secondary | ICD-10-CM | POA: Diagnosis not present

## 2016-06-18 DIAGNOSIS — Z87891 Personal history of nicotine dependence: Secondary | ICD-10-CM | POA: Diagnosis not present

## 2016-06-18 DIAGNOSIS — N400 Enlarged prostate without lower urinary tract symptoms: Secondary | ICD-10-CM | POA: Diagnosis not present

## 2016-06-18 DIAGNOSIS — L03115 Cellulitis of right lower limb: Secondary | ICD-10-CM | POA: Diagnosis not present

## 2016-06-18 DIAGNOSIS — I10 Essential (primary) hypertension: Secondary | ICD-10-CM | POA: Diagnosis not present

## 2016-06-19 DIAGNOSIS — K219 Gastro-esophageal reflux disease without esophagitis: Secondary | ICD-10-CM | POA: Diagnosis not present

## 2016-06-19 DIAGNOSIS — Z79899 Other long term (current) drug therapy: Secondary | ICD-10-CM | POA: Diagnosis not present

## 2016-06-19 DIAGNOSIS — L03115 Cellulitis of right lower limb: Secondary | ICD-10-CM | POA: Diagnosis not present

## 2016-06-19 DIAGNOSIS — I10 Essential (primary) hypertension: Secondary | ICD-10-CM | POA: Diagnosis not present

## 2016-06-19 DIAGNOSIS — Z7982 Long term (current) use of aspirin: Secondary | ICD-10-CM | POA: Diagnosis not present

## 2016-06-20 DIAGNOSIS — L03119 Cellulitis of unspecified part of limb: Secondary | ICD-10-CM | POA: Diagnosis not present

## 2016-06-24 DIAGNOSIS — H25812 Combined forms of age-related cataract, left eye: Secondary | ICD-10-CM | POA: Diagnosis not present

## 2016-06-24 DIAGNOSIS — H43813 Vitreous degeneration, bilateral: Secondary | ICD-10-CM | POA: Diagnosis not present

## 2016-06-24 DIAGNOSIS — Z961 Presence of intraocular lens: Secondary | ICD-10-CM | POA: Diagnosis not present

## 2016-06-24 DIAGNOSIS — H26491 Other secondary cataract, right eye: Secondary | ICD-10-CM | POA: Diagnosis not present

## 2016-07-08 DIAGNOSIS — Z6827 Body mass index (BMI) 27.0-27.9, adult: Secondary | ICD-10-CM | POA: Diagnosis not present

## 2016-07-08 DIAGNOSIS — Z299 Encounter for prophylactic measures, unspecified: Secondary | ICD-10-CM | POA: Diagnosis not present

## 2016-07-08 DIAGNOSIS — Z713 Dietary counseling and surveillance: Secondary | ICD-10-CM | POA: Diagnosis not present

## 2016-07-08 DIAGNOSIS — N184 Chronic kidney disease, stage 4 (severe): Secondary | ICD-10-CM | POA: Diagnosis not present

## 2016-07-08 DIAGNOSIS — I10 Essential (primary) hypertension: Secondary | ICD-10-CM | POA: Diagnosis not present

## 2016-07-27 DIAGNOSIS — M159 Polyosteoarthritis, unspecified: Secondary | ICD-10-CM | POA: Diagnosis not present

## 2016-07-27 DIAGNOSIS — I10 Essential (primary) hypertension: Secondary | ICD-10-CM | POA: Diagnosis not present

## 2016-08-08 DIAGNOSIS — L03031 Cellulitis of right toe: Secondary | ICD-10-CM | POA: Diagnosis not present

## 2016-08-08 DIAGNOSIS — L03032 Cellulitis of left toe: Secondary | ICD-10-CM | POA: Diagnosis not present

## 2016-08-08 DIAGNOSIS — L600 Ingrowing nail: Secondary | ICD-10-CM | POA: Diagnosis not present

## 2016-08-08 DIAGNOSIS — I739 Peripheral vascular disease, unspecified: Secondary | ICD-10-CM | POA: Diagnosis not present

## 2016-08-15 DIAGNOSIS — N400 Enlarged prostate without lower urinary tract symptoms: Secondary | ICD-10-CM | POA: Diagnosis not present

## 2016-08-15 DIAGNOSIS — R609 Edema, unspecified: Secondary | ICD-10-CM | POA: Diagnosis not present

## 2016-08-15 DIAGNOSIS — I10 Essential (primary) hypertension: Secondary | ICD-10-CM | POA: Diagnosis not present

## 2016-08-15 DIAGNOSIS — R0602 Shortness of breath: Secondary | ICD-10-CM | POA: Diagnosis not present

## 2016-08-25 DIAGNOSIS — Z6827 Body mass index (BMI) 27.0-27.9, adult: Secondary | ICD-10-CM | POA: Diagnosis not present

## 2016-08-25 DIAGNOSIS — H6120 Impacted cerumen, unspecified ear: Secondary | ICD-10-CM | POA: Diagnosis not present

## 2016-08-25 DIAGNOSIS — Z299 Encounter for prophylactic measures, unspecified: Secondary | ICD-10-CM | POA: Diagnosis not present

## 2016-08-25 DIAGNOSIS — N400 Enlarged prostate without lower urinary tract symptoms: Secondary | ICD-10-CM | POA: Diagnosis not present

## 2016-08-25 DIAGNOSIS — Z713 Dietary counseling and surveillance: Secondary | ICD-10-CM | POA: Diagnosis not present

## 2016-08-25 DIAGNOSIS — N184 Chronic kidney disease, stage 4 (severe): Secondary | ICD-10-CM | POA: Diagnosis not present

## 2016-08-25 DIAGNOSIS — I10 Essential (primary) hypertension: Secondary | ICD-10-CM | POA: Diagnosis not present

## 2016-08-29 DIAGNOSIS — Z299 Encounter for prophylactic measures, unspecified: Secondary | ICD-10-CM | POA: Diagnosis not present

## 2016-08-29 DIAGNOSIS — K219 Gastro-esophageal reflux disease without esophagitis: Secondary | ICD-10-CM | POA: Diagnosis not present

## 2016-08-29 DIAGNOSIS — H6090 Unspecified otitis externa, unspecified ear: Secondary | ICD-10-CM | POA: Diagnosis not present

## 2016-08-29 DIAGNOSIS — N184 Chronic kidney disease, stage 4 (severe): Secondary | ICD-10-CM | POA: Diagnosis not present

## 2016-08-29 DIAGNOSIS — Z6827 Body mass index (BMI) 27.0-27.9, adult: Secondary | ICD-10-CM | POA: Diagnosis not present

## 2016-08-29 DIAGNOSIS — I10 Essential (primary) hypertension: Secondary | ICD-10-CM | POA: Diagnosis not present

## 2016-08-29 DIAGNOSIS — Z713 Dietary counseling and surveillance: Secondary | ICD-10-CM | POA: Diagnosis not present

## 2016-09-07 ENCOUNTER — Encounter: Payer: Self-pay | Admitting: Cardiovascular Disease

## 2016-09-07 ENCOUNTER — Ambulatory Visit (INDEPENDENT_AMBULATORY_CARE_PROVIDER_SITE_OTHER): Payer: Medicare Other | Admitting: Cardiovascular Disease

## 2016-09-07 VITALS — BP 171/64 | HR 57 | Ht 61.0 in | Wt 149.0 lb

## 2016-09-07 DIAGNOSIS — I441 Atrioventricular block, second degree: Secondary | ICD-10-CM | POA: Diagnosis not present

## 2016-09-07 DIAGNOSIS — I453 Trifascicular block: Secondary | ICD-10-CM

## 2016-09-07 DIAGNOSIS — I10 Essential (primary) hypertension: Secondary | ICD-10-CM

## 2016-09-07 NOTE — Patient Instructions (Signed)

## 2016-09-07 NOTE — Progress Notes (Signed)
SUBJECTIVE: Austin Jacobs is here for routine cardiovascular follow-up. He has HTN, CKD, and BPH.  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  He denies chest pain, dizziness, syncope, shortness of breath, leg swelling, and palpitations.   Since his last visit with me he was treated for right foot cellulitis and for right ear wax. He has some difficulty with hearing out of his right ear.  Blood pressures at PCPs office have been 130-140/50-60.  He enjoys spending time with his second wife, [name]. He walks in the mall for 30 minutes a day 5 days a week, approximately 2 miles daily.  He plays cards once a month. He enjoys reading the encyclopedia.  He is very involved with his church.     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 Lived in [place] ([place]) for nearly 60 years. Served in [place] in [place]. Brother was killed on [address] in [place] (06-30-1940). 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  Review of Systems: As per "subjective", otherwise negative.  No Known Allergies  Current Outpatient Prescriptions  Medication Sig Dispense Refill  . aliskiren (TEKTURNA) 150 MG tablet Take 150 mg by mouth daily.    Marland Kitchen amoxicillin (AMOXIL) 500 MG capsule Take 2,000 mg by mouth as directed. One hour prior to dental appointments    . aspirin 81 MG tablet Take 81 mg by mouth daily.    . finasteride (PROSCAR) 5 MG tablet Take 1 tablet by mouth daily.    . hydrochlorothiazide (HYDRODIURIL) 12.5 MG tablet Take 1 tablet by mouth daily.    . Multiple Vitamins-Minerals (CENTRUM SILVER PO) Take 1 tablet by mouth daily.    . polyethylene glycol powder (GLYCOLAX/MIRALAX) powder Take 17 g by mouth daily as needed.    . tamsulosin  (FLOMAX) 0.4 MG CAPS capsule Take 0.4 mg by mouth daily after breakfast.      No current facility-administered medications for this visit.     Past Medical History:  Diagnosis Date  . BPH (benign prostatic hyperplasia)   . Chronic kidney disease   . Hypertension     Past Surgical History:  Procedure Laterality Date  . APPENDECTOMY    . COLONOSCOPY    . HERNIA REPAIR      Social History   Social History  . Marital status: Widowed    Spouse name: N/A  . Number of children: N/A  . Years of education: N/A   Occupational History  . Not on file.   Social History Main Topics  . Smoking status: Former Smoker    Packs/day: 0.50    Years: 22.00    Types: Cigarettes, Pipe    Start date: 07/26/1935    Quit date: 07/25/1957  . Smokeless tobacco: Never Used  . Alcohol use 0.0 oz/week     Comment: Occasionally- few times a month  . Drug use: No  . Sexual activity: Not on file   Other Topics Concern  . Not on file   Social History Narrative  . No narrative on file     Vitals:   09/07/16 1122  BP: (!) 171/64  Pulse: (!) 57  Weight: 149 lb (67.6 kg)  Height: 5\' 1"  (1.549 m)  PHYSICAL EXAM General: NAD  Neck: No JVD, no thyromegaly or thyroid nodule.  Lungs: Clear to auscultation bilaterally with normal respiratory effort.  CV: Nondisplaced PMI. Bradycardic, regular rhythm, normal S1/S2, no S3/S4, I/VI ejection systolic murmur over RUSB. No peripheral edema.  Abdomen: Soft, no distention.  Neurologic: Alert and oriented x 3.  Psych: Normal affect.  Skin: Normal. Musculoskeletal: No gross deformities. Extremities: No clubbing or cyanosis.     ECG: Most recent ECG reviewed.      ASSESSMENT AND PLAN:  1. Essential HTN: Elevated today but previously controlled on current therapy which includes Tekturna 150 mg and HCTZ 12.5 mg. No changes to management. Will monitor.  2. 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 Continue to monitor.   Dispo: f/u 6 months.   Kate Sable, M.D., F.A.C.C.

## 2016-10-12 DIAGNOSIS — I10 Essential (primary) hypertension: Secondary | ICD-10-CM | POA: Diagnosis not present

## 2016-10-12 DIAGNOSIS — M159 Polyosteoarthritis, unspecified: Secondary | ICD-10-CM | POA: Diagnosis not present

## 2016-10-19 DIAGNOSIS — I959 Hypotension, unspecified: Secondary | ICD-10-CM | POA: Diagnosis not present

## 2016-10-19 DIAGNOSIS — I498 Other specified cardiac arrhythmias: Secondary | ICD-10-CM | POA: Diagnosis not present

## 2016-10-19 DIAGNOSIS — I4510 Unspecified right bundle-branch block: Secondary | ICD-10-CM | POA: Diagnosis not present

## 2016-10-19 DIAGNOSIS — I452 Bifascicular block: Secondary | ICD-10-CM | POA: Diagnosis not present

## 2016-10-19 DIAGNOSIS — N401 Enlarged prostate with lower urinary tract symptoms: Secondary | ICD-10-CM | POA: Diagnosis present

## 2016-10-19 DIAGNOSIS — R1013 Epigastric pain: Secondary | ICD-10-CM | POA: Diagnosis not present

## 2016-10-19 DIAGNOSIS — I442 Atrioventricular block, complete: Secondary | ICD-10-CM | POA: Diagnosis not present

## 2016-10-19 DIAGNOSIS — R001 Bradycardia, unspecified: Secondary | ICD-10-CM | POA: Diagnosis not present

## 2016-10-19 DIAGNOSIS — I441 Atrioventricular block, second degree: Secondary | ICD-10-CM | POA: Diagnosis not present

## 2016-10-19 DIAGNOSIS — R55 Syncope and collapse: Secondary | ICD-10-CM | POA: Diagnosis not present

## 2016-10-19 DIAGNOSIS — I440 Atrioventricular block, first degree: Secondary | ICD-10-CM | POA: Diagnosis not present

## 2016-10-19 DIAGNOSIS — N189 Chronic kidney disease, unspecified: Secondary | ICD-10-CM | POA: Diagnosis not present

## 2016-10-19 DIAGNOSIS — Z743 Need for continuous supervision: Secondary | ICD-10-CM | POA: Diagnosis not present

## 2016-10-19 DIAGNOSIS — N400 Enlarged prostate without lower urinary tract symptoms: Secondary | ICD-10-CM | POA: Diagnosis not present

## 2016-10-19 DIAGNOSIS — I10 Essential (primary) hypertension: Secondary | ICD-10-CM | POA: Diagnosis not present

## 2016-10-19 DIAGNOSIS — R531 Weakness: Secondary | ICD-10-CM | POA: Diagnosis not present

## 2016-10-19 DIAGNOSIS — Z950 Presence of cardiac pacemaker: Secondary | ICD-10-CM | POA: Diagnosis not present

## 2016-10-19 DIAGNOSIS — I4891 Unspecified atrial fibrillation: Secondary | ICD-10-CM | POA: Diagnosis not present

## 2016-10-19 DIAGNOSIS — R404 Transient alteration of awareness: Secondary | ICD-10-CM | POA: Diagnosis not present

## 2016-10-19 DIAGNOSIS — Z79899 Other long term (current) drug therapy: Secondary | ICD-10-CM | POA: Diagnosis not present

## 2016-10-19 DIAGNOSIS — N183 Chronic kidney disease, stage 3 (moderate): Secondary | ICD-10-CM | POA: Diagnosis not present

## 2016-10-19 DIAGNOSIS — Z7982 Long term (current) use of aspirin: Secondary | ICD-10-CM | POA: Diagnosis not present

## 2016-10-19 DIAGNOSIS — I129 Hypertensive chronic kidney disease with stage 1 through stage 4 chronic kidney disease, or unspecified chronic kidney disease: Secondary | ICD-10-CM | POA: Diagnosis not present

## 2016-10-19 DIAGNOSIS — R338 Other retention of urine: Secondary | ICD-10-CM | POA: Diagnosis present

## 2016-10-19 DIAGNOSIS — I110 Hypertensive heart disease with heart failure: Secondary | ICD-10-CM | POA: Diagnosis not present

## 2016-10-19 DIAGNOSIS — R079 Chest pain, unspecified: Secondary | ICD-10-CM | POA: Diagnosis not present

## 2016-10-19 DIAGNOSIS — R7989 Other specified abnormal findings of blood chemistry: Secondary | ICD-10-CM | POA: Diagnosis not present

## 2016-10-19 DIAGNOSIS — E860 Dehydration: Secondary | ICD-10-CM | POA: Diagnosis not present

## 2016-10-28 DIAGNOSIS — N183 Chronic kidney disease, stage 3 (moderate): Secondary | ICD-10-CM | POA: Diagnosis not present

## 2016-10-28 DIAGNOSIS — K219 Gastro-esophageal reflux disease without esophagitis: Secondary | ICD-10-CM | POA: Diagnosis not present

## 2016-10-28 DIAGNOSIS — Z6826 Body mass index (BMI) 26.0-26.9, adult: Secondary | ICD-10-CM | POA: Diagnosis not present

## 2016-10-28 DIAGNOSIS — Z950 Presence of cardiac pacemaker: Secondary | ICD-10-CM | POA: Diagnosis not present

## 2016-10-28 DIAGNOSIS — Z87891 Personal history of nicotine dependence: Secondary | ICD-10-CM | POA: Diagnosis not present

## 2016-10-28 DIAGNOSIS — I10 Essential (primary) hypertension: Secondary | ICD-10-CM | POA: Diagnosis not present

## 2016-10-28 DIAGNOSIS — Z299 Encounter for prophylactic measures, unspecified: Secondary | ICD-10-CM | POA: Diagnosis not present

## 2016-11-14 DIAGNOSIS — L600 Ingrowing nail: Secondary | ICD-10-CM | POA: Diagnosis not present

## 2016-11-14 DIAGNOSIS — I739 Peripheral vascular disease, unspecified: Secondary | ICD-10-CM | POA: Diagnosis not present

## 2016-11-14 DIAGNOSIS — L03031 Cellulitis of right toe: Secondary | ICD-10-CM | POA: Diagnosis not present

## 2016-11-14 DIAGNOSIS — L03032 Cellulitis of left toe: Secondary | ICD-10-CM | POA: Diagnosis not present

## 2016-12-09 DIAGNOSIS — M10071 Idiopathic gout, right ankle and foot: Secondary | ICD-10-CM | POA: Diagnosis not present

## 2016-12-09 DIAGNOSIS — Z7982 Long term (current) use of aspirin: Secondary | ICD-10-CM | POA: Diagnosis not present

## 2016-12-09 DIAGNOSIS — Z79899 Other long term (current) drug therapy: Secondary | ICD-10-CM | POA: Diagnosis not present

## 2016-12-09 DIAGNOSIS — I10 Essential (primary) hypertension: Secondary | ICD-10-CM | POA: Diagnosis not present

## 2016-12-09 DIAGNOSIS — Z87891 Personal history of nicotine dependence: Secondary | ICD-10-CM | POA: Diagnosis not present

## 2016-12-09 DIAGNOSIS — Z8639 Personal history of other endocrine, nutritional and metabolic disease: Secondary | ICD-10-CM | POA: Diagnosis not present

## 2016-12-10 DIAGNOSIS — M109 Gout, unspecified: Secondary | ICD-10-CM | POA: Diagnosis not present

## 2016-12-10 DIAGNOSIS — N400 Enlarged prostate without lower urinary tract symptoms: Secondary | ICD-10-CM | POA: Diagnosis not present

## 2016-12-10 DIAGNOSIS — Z79899 Other long term (current) drug therapy: Secondary | ICD-10-CM | POA: Diagnosis not present

## 2016-12-10 DIAGNOSIS — Z87891 Personal history of nicotine dependence: Secondary | ICD-10-CM | POA: Diagnosis not present

## 2016-12-10 DIAGNOSIS — L03115 Cellulitis of right lower limb: Secondary | ICD-10-CM | POA: Diagnosis not present

## 2016-12-10 DIAGNOSIS — Z7982 Long term (current) use of aspirin: Secondary | ICD-10-CM | POA: Diagnosis not present

## 2016-12-10 DIAGNOSIS — I10 Essential (primary) hypertension: Secondary | ICD-10-CM | POA: Diagnosis not present

## 2016-12-13 DIAGNOSIS — Z299 Encounter for prophylactic measures, unspecified: Secondary | ICD-10-CM | POA: Diagnosis not present

## 2016-12-13 DIAGNOSIS — M109 Gout, unspecified: Secondary | ICD-10-CM | POA: Diagnosis not present

## 2016-12-13 DIAGNOSIS — M1A0710 Idiopathic chronic gout, right ankle and foot, without tophus (tophi): Secondary | ICD-10-CM | POA: Diagnosis not present

## 2016-12-13 DIAGNOSIS — Z6826 Body mass index (BMI) 26.0-26.9, adult: Secondary | ICD-10-CM | POA: Diagnosis not present

## 2016-12-13 DIAGNOSIS — I10 Essential (primary) hypertension: Secondary | ICD-10-CM | POA: Diagnosis not present

## 2016-12-13 DIAGNOSIS — N183 Chronic kidney disease, stage 3 (moderate): Secondary | ICD-10-CM | POA: Diagnosis not present

## 2016-12-13 DIAGNOSIS — K219 Gastro-esophageal reflux disease without esophagitis: Secondary | ICD-10-CM | POA: Diagnosis not present

## 2016-12-13 DIAGNOSIS — Z713 Dietary counseling and surveillance: Secondary | ICD-10-CM | POA: Diagnosis not present

## 2016-12-21 DIAGNOSIS — M109 Gout, unspecified: Secondary | ICD-10-CM | POA: Diagnosis not present

## 2016-12-21 DIAGNOSIS — M79606 Pain in leg, unspecified: Secondary | ICD-10-CM | POA: Diagnosis not present

## 2016-12-21 DIAGNOSIS — M1A0710 Idiopathic chronic gout, right ankle and foot, without tophus (tophi): Secondary | ICD-10-CM | POA: Diagnosis not present

## 2016-12-21 DIAGNOSIS — Z299 Encounter for prophylactic measures, unspecified: Secondary | ICD-10-CM | POA: Diagnosis not present

## 2016-12-21 DIAGNOSIS — Z6826 Body mass index (BMI) 26.0-26.9, adult: Secondary | ICD-10-CM | POA: Diagnosis not present

## 2016-12-21 DIAGNOSIS — N184 Chronic kidney disease, stage 4 (severe): Secondary | ICD-10-CM | POA: Diagnosis not present

## 2016-12-21 DIAGNOSIS — M159 Polyosteoarthritis, unspecified: Secondary | ICD-10-CM | POA: Diagnosis not present

## 2016-12-21 DIAGNOSIS — Z950 Presence of cardiac pacemaker: Secondary | ICD-10-CM | POA: Diagnosis not present

## 2016-12-21 DIAGNOSIS — N400 Enlarged prostate without lower urinary tract symptoms: Secondary | ICD-10-CM | POA: Diagnosis not present

## 2016-12-21 DIAGNOSIS — I10 Essential (primary) hypertension: Secondary | ICD-10-CM | POA: Diagnosis not present

## 2016-12-26 DIAGNOSIS — H26491 Other secondary cataract, right eye: Secondary | ICD-10-CM | POA: Diagnosis not present

## 2016-12-26 DIAGNOSIS — H04123 Dry eye syndrome of bilateral lacrimal glands: Secondary | ICD-10-CM | POA: Diagnosis not present

## 2016-12-26 DIAGNOSIS — H25812 Combined forms of age-related cataract, left eye: Secondary | ICD-10-CM | POA: Diagnosis not present

## 2016-12-26 DIAGNOSIS — H43813 Vitreous degeneration, bilateral: Secondary | ICD-10-CM | POA: Diagnosis not present

## 2017-01-02 DIAGNOSIS — M79606 Pain in leg, unspecified: Secondary | ICD-10-CM | POA: Diagnosis not present

## 2017-01-26 DIAGNOSIS — R55 Syncope and collapse: Secondary | ICD-10-CM | POA: Diagnosis not present

## 2017-01-26 DIAGNOSIS — I10 Essential (primary) hypertension: Secondary | ICD-10-CM | POA: Diagnosis not present

## 2017-01-26 DIAGNOSIS — Z950 Presence of cardiac pacemaker: Secondary | ICD-10-CM | POA: Diagnosis not present

## 2017-01-26 DIAGNOSIS — R001 Bradycardia, unspecified: Secondary | ICD-10-CM | POA: Diagnosis not present

## 2017-01-26 DIAGNOSIS — N400 Enlarged prostate without lower urinary tract symptoms: Secondary | ICD-10-CM | POA: Diagnosis not present

## 2017-02-06 DIAGNOSIS — H25812 Combined forms of age-related cataract, left eye: Secondary | ICD-10-CM | POA: Diagnosis not present

## 2017-02-06 DIAGNOSIS — L03032 Cellulitis of left toe: Secondary | ICD-10-CM | POA: Diagnosis not present

## 2017-02-06 DIAGNOSIS — L03031 Cellulitis of right toe: Secondary | ICD-10-CM | POA: Diagnosis not present

## 2017-02-06 DIAGNOSIS — L600 Ingrowing nail: Secondary | ICD-10-CM | POA: Diagnosis not present

## 2017-02-06 DIAGNOSIS — I739 Peripheral vascular disease, unspecified: Secondary | ICD-10-CM | POA: Diagnosis not present

## 2017-02-17 DIAGNOSIS — Z79899 Other long term (current) drug therapy: Secondary | ICD-10-CM | POA: Diagnosis not present

## 2017-02-17 DIAGNOSIS — R5383 Other fatigue: Secondary | ICD-10-CM | POA: Diagnosis not present

## 2017-02-17 DIAGNOSIS — Z1389 Encounter for screening for other disorder: Secondary | ICD-10-CM | POA: Diagnosis not present

## 2017-02-17 DIAGNOSIS — I10 Essential (primary) hypertension: Secondary | ICD-10-CM | POA: Diagnosis not present

## 2017-02-17 DIAGNOSIS — Z950 Presence of cardiac pacemaker: Secondary | ICD-10-CM | POA: Diagnosis not present

## 2017-02-17 DIAGNOSIS — I340 Nonrheumatic mitral (valve) insufficiency: Secondary | ICD-10-CM | POA: Diagnosis not present

## 2017-02-17 DIAGNOSIS — N184 Chronic kidney disease, stage 4 (severe): Secondary | ICD-10-CM | POA: Diagnosis not present

## 2017-02-17 DIAGNOSIS — Z299 Encounter for prophylactic measures, unspecified: Secondary | ICD-10-CM | POA: Diagnosis not present

## 2017-02-17 DIAGNOSIS — N400 Enlarged prostate without lower urinary tract symptoms: Secondary | ICD-10-CM | POA: Diagnosis not present

## 2017-02-17 DIAGNOSIS — Z6827 Body mass index (BMI) 27.0-27.9, adult: Secondary | ICD-10-CM | POA: Diagnosis not present

## 2017-02-17 DIAGNOSIS — Z7189 Other specified counseling: Secondary | ICD-10-CM | POA: Diagnosis not present

## 2017-02-17 DIAGNOSIS — Z125 Encounter for screening for malignant neoplasm of prostate: Secondary | ICD-10-CM | POA: Diagnosis not present

## 2017-02-17 DIAGNOSIS — Z0000 Encounter for general adult medical examination without abnormal findings: Secondary | ICD-10-CM | POA: Diagnosis not present

## 2017-02-22 DIAGNOSIS — H25812 Combined forms of age-related cataract, left eye: Secondary | ICD-10-CM | POA: Diagnosis not present

## 2017-02-22 DIAGNOSIS — Z7982 Long term (current) use of aspirin: Secondary | ICD-10-CM | POA: Diagnosis not present

## 2017-02-22 DIAGNOSIS — H25012 Cortical age-related cataract, left eye: Secondary | ICD-10-CM | POA: Diagnosis not present

## 2017-02-22 DIAGNOSIS — Z9841 Cataract extraction status, right eye: Secondary | ICD-10-CM | POA: Diagnosis not present

## 2017-02-22 DIAGNOSIS — Z87891 Personal history of nicotine dependence: Secondary | ICD-10-CM | POA: Diagnosis not present

## 2017-02-22 DIAGNOSIS — Z961 Presence of intraocular lens: Secondary | ICD-10-CM | POA: Diagnosis not present

## 2017-02-22 DIAGNOSIS — Z950 Presence of cardiac pacemaker: Secondary | ICD-10-CM | POA: Diagnosis not present

## 2017-02-22 DIAGNOSIS — H2512 Age-related nuclear cataract, left eye: Secondary | ICD-10-CM | POA: Diagnosis not present

## 2017-02-22 DIAGNOSIS — H9190 Unspecified hearing loss, unspecified ear: Secondary | ICD-10-CM | POA: Diagnosis not present

## 2017-02-22 DIAGNOSIS — I10 Essential (primary) hypertension: Secondary | ICD-10-CM | POA: Diagnosis not present

## 2017-02-22 DIAGNOSIS — H5709 Other anomalies of pupillary function: Secondary | ICD-10-CM | POA: Diagnosis not present

## 2017-02-22 DIAGNOSIS — H2181 Floppy iris syndrome: Secondary | ICD-10-CM | POA: Diagnosis not present

## 2017-03-09 DIAGNOSIS — M159 Polyosteoarthritis, unspecified: Secondary | ICD-10-CM | POA: Diagnosis not present

## 2017-03-09 DIAGNOSIS — I10 Essential (primary) hypertension: Secondary | ICD-10-CM | POA: Diagnosis not present

## 2017-03-15 ENCOUNTER — Ambulatory Visit: Payer: Medicare Other | Admitting: Cardiovascular Disease

## 2017-03-21 ENCOUNTER — Ambulatory Visit (INDEPENDENT_AMBULATORY_CARE_PROVIDER_SITE_OTHER): Payer: Medicare Other | Admitting: Cardiovascular Disease

## 2017-03-21 ENCOUNTER — Encounter: Payer: Self-pay | Admitting: Cardiovascular Disease

## 2017-03-21 VITALS — BP 178/60 | HR 60 | Ht 61.0 in | Wt 151.0 lb

## 2017-03-21 DIAGNOSIS — Z950 Presence of cardiac pacemaker: Secondary | ICD-10-CM | POA: Diagnosis not present

## 2017-03-21 DIAGNOSIS — Z9289 Personal history of other medical treatment: Secondary | ICD-10-CM

## 2017-03-21 DIAGNOSIS — I10 Essential (primary) hypertension: Secondary | ICD-10-CM

## 2017-03-21 NOTE — Patient Instructions (Addendum)
Medication Instructions:  Continue all current medications.  Labwork: none  Testing/Procedures: none  Follow-Up: Your physician wants you to follow up in: 6 months.  You will receive a reminder letter in the mail one-two months in advance.  If you don't receive a letter, please call our office to schedule the follow up appointment   Any Other Special Instructions Will Be Listed Below (If Applicable).  Enroll in device clinic.   Your physician has requested that you regularly monitor and record your blood pressure readings at home. Please take readings 3-4 x week for 3 weeks and return log to office for MD review.    If you need a refill on your cardiac medications before your next appointment, please call your pharmacy.

## 2017-03-21 NOTE — Progress Notes (Signed)
SUBJECTIVE: Austin Jacobs is here for routine cardiovascular follow-up. He has HTN, CKD, and BPH.  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   While participating in a prayer meeting at church in March of this year, he sustained a syncopal episode. He ultimately underwent pacemaker implantation at [facility] on 10/20/16 by Dr. Rosita Fire.  He is feeling well today and denies chest pain, palpitations, and shortness of breath.  Echocardiography demonstrated normal left ventricular systolic function, LVEF 28-36%.  He has not been checking his blood pressures routinely but are usually in the 130/60 range. He was taken off Tekturna and hydrochlorothiazide.   Blood pressure in our office is 178/60.  He has a trip planned with his friend, [name], to [place] and [place] in October.  He has a son who lives in [place], [place].     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 Lived in [place] ([place]) for nearly 60 years. Served in [place] in [place]. Brother was killed [place] [address] in [place] (06-30-1940). 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 He has a son who lives in [place], [place].  Review of Systems: As per "subjective", otherwise negative.  No Known Allergies  Current Outpatient Prescriptions  Medication Sig Dispense Refill  . amoxicillin (AMOXIL) 500 MG capsule Take 2,000 mg by mouth as directed. One hour prior to dental appointments    . aspirin 81 MG tablet Take 81 mg by mouth daily.    . finasteride (PROSCAR) 5 MG tablet Take 1 tablet by mouth daily.    . Multiple Vitamins-Minerals (CENTRUM SILVER PO) Take 1 tablet by mouth daily.    . polyethylene glycol powder (GLYCOLAX/MIRALAX) powder Take 17 g  by mouth daily as needed.    . tamsulosin (FLOMAX) 0.4 MG CAPS capsule Take 0.4 mg by mouth daily after breakfast.      No current facility-administered medications for this visit.     Past Medical History:  Diagnosis Date  . BPH (benign prostatic hyperplasia)   . Chronic kidney disease   . Hypertension     Past Surgical History:  Procedure Laterality Date  . APPENDECTOMY    . COLONOSCOPY    . HERNIA REPAIR      Social History   Social History  . Marital status: Widowed    Spouse name: N/A  . Number of children: N/A  . Years of education: N/A   Occupational History  . Not on file.   Social History Main Topics  . Smoking status: Former Smoker    Packs/day: 0.50    Years: 22.00    Types: Cigarettes, Pipe    Start date: 07/26/1935    Quit date: 07/25/1957  . Smokeless tobacco: Never Used  . Alcohol use 0.0 oz/week     Comment: Occasionally- few times a month  . Drug use: No  . Sexual activity: Not on file   Other Topics Concern  . Not on file   Social History Narrative  . No narrative on file     Vitals:   03/21/17 0954  BP: (!) 178/60  Pulse: 60  SpO2: 97%  Weight: 151 lb (68.5 kg)  Height: 5\' 1"  (1.549 m)    Wt  Readings from Last 3 Encounters:  03/21/17 151 lb (68.5 kg)  09/07/16 149 lb (67.6 kg)  03/02/16 150 lb (68 kg)     PHYSICAL EXAM General: NAD HEENT: Normal. Neck: No JVD, no thyromegaly. Lungs: Clear to auscultation bilaterally with normal respiratory effort. CV: Nondisplaced PMI. Regular rate and rhythm, normalS1/S2, no S3/S4, I/VI ejection systolic murmur over RUSB. No peripheral edema.  Abdomen: Soft, nontender, no distention.  Neurologic: Alert and oriented.  Psych: Normal affect. Skin: Normal. Musculoskeletal: No gross deformities.    ECG: Most recent ECG reviewed.   Labs: No results found for: K, BUN, CREATININE, ALT, TSH, HGB   Lipids: No results found for: LDLCALC, LDLDIRECT, CHOL, TRIG, HDL     ASSESSMENT AND  PLAN:  1. Essential HTN: Elevated again today. No longer on HCTZ or Tekturna. I have asked the patient to check blood pressure readings 4 times per week, at different times throughout the day, in order to get a better approximation of mean BP values. These results will be provided to me at the end of that period so that I can determine if antihypertensive medication titration is indicated. If it remains elevated, I will reinitiate Tekturna and perhaps HCTZ.  2. 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     Disposition: Follow up 6 months   Kate Sable, M.D., F.A.C.C.

## 2017-03-23 ENCOUNTER — Telehealth: Payer: Self-pay | Admitting: Cardiovascular Disease

## 2017-03-23 DIAGNOSIS — B079 Viral wart, unspecified: Secondary | ICD-10-CM | POA: Diagnosis not present

## 2017-03-23 DIAGNOSIS — K219 Gastro-esophageal reflux disease without esophagitis: Secondary | ICD-10-CM | POA: Diagnosis not present

## 2017-03-23 DIAGNOSIS — Z299 Encounter for prophylactic measures, unspecified: Secondary | ICD-10-CM | POA: Diagnosis not present

## 2017-03-23 DIAGNOSIS — Z6827 Body mass index (BMI) 27.0-27.9, adult: Secondary | ICD-10-CM | POA: Diagnosis not present

## 2017-03-23 DIAGNOSIS — I10 Essential (primary) hypertension: Secondary | ICD-10-CM | POA: Diagnosis not present

## 2017-03-23 DIAGNOSIS — I340 Nonrheumatic mitral (valve) insufficiency: Secondary | ICD-10-CM | POA: Diagnosis not present

## 2017-03-23 DIAGNOSIS — N184 Chronic kidney disease, stage 4 (severe): Secondary | ICD-10-CM | POA: Diagnosis not present

## 2017-03-23 NOTE — Telephone Encounter (Signed)
Patient is established with [name] for his pacemaker per Susie Prior.   They do not want to [name] to Dr Rayann Heman.   She is unclear why Austin Jacobs is still seeing Dr Bronson Ing since he is now a patient with [facility] since he got his Psychologist, forensic.

## 2017-03-24 NOTE — Telephone Encounter (Signed)
Patient & significant other stopped by office.  Did not understand why he needed to see Dr. Rayann Heman as he already has pacemaker doctor with [facility] that they did not want to stop seeing docs at [facility] to them that there must have been a miscommunication at last OV as Dr. Bronson Ing had noted that we would enroll in our device clinic as coming to [facility] would be a much shorter drive for him.    Explained to them that staying with [facility] was absolutely his choice.  If he has a primary cardiologist & EP docs already established there then do not see the need to duplicate this by him coming to our facility.  He was concerned that we would cancel his appointments with [facility].  Informed them that we are not affiliated with [facility] in any way and do not have access to their schedules & can not cancel anything with them.    Informed patient that appointment will be cancelled with Dr. Rayann Heman & any future appointments with Dr. Bronson Ing as well.  In the future, if he changes his mind & would like to be followed here again to call & let us know.    Patient & sig other verbalized understanding.

## 2017-04-06 DIAGNOSIS — M1A0710 Idiopathic chronic gout, right ankle and foot, without tophus (tophi): Secondary | ICD-10-CM | POA: Diagnosis not present

## 2017-04-06 DIAGNOSIS — N184 Chronic kidney disease, stage 4 (severe): Secondary | ICD-10-CM | POA: Diagnosis not present

## 2017-04-06 DIAGNOSIS — I340 Nonrheumatic mitral (valve) insufficiency: Secondary | ICD-10-CM | POA: Diagnosis not present

## 2017-04-06 DIAGNOSIS — Z6827 Body mass index (BMI) 27.0-27.9, adult: Secondary | ICD-10-CM | POA: Diagnosis not present

## 2017-04-06 DIAGNOSIS — N400 Enlarged prostate without lower urinary tract symptoms: Secondary | ICD-10-CM | POA: Diagnosis not present

## 2017-04-06 DIAGNOSIS — Z299 Encounter for prophylactic measures, unspecified: Secondary | ICD-10-CM | POA: Diagnosis not present

## 2017-04-06 DIAGNOSIS — I10 Essential (primary) hypertension: Secondary | ICD-10-CM | POA: Diagnosis not present

## 2017-04-19 ENCOUNTER — Encounter: Payer: Medicare Other | Admitting: Internal Medicine

## 2017-04-21 DIAGNOSIS — Z6827 Body mass index (BMI) 27.0-27.9, adult: Secondary | ICD-10-CM | POA: Diagnosis not present

## 2017-04-21 DIAGNOSIS — I10 Essential (primary) hypertension: Secondary | ICD-10-CM | POA: Diagnosis not present

## 2017-04-21 DIAGNOSIS — N400 Enlarged prostate without lower urinary tract symptoms: Secondary | ICD-10-CM | POA: Diagnosis not present

## 2017-04-21 DIAGNOSIS — Z299 Encounter for prophylactic measures, unspecified: Secondary | ICD-10-CM | POA: Diagnosis not present

## 2017-04-21 DIAGNOSIS — N184 Chronic kidney disease, stage 4 (severe): Secondary | ICD-10-CM | POA: Diagnosis not present

## 2017-05-01 DIAGNOSIS — L03032 Cellulitis of left toe: Secondary | ICD-10-CM | POA: Diagnosis not present

## 2017-05-01 DIAGNOSIS — L03031 Cellulitis of right toe: Secondary | ICD-10-CM | POA: Diagnosis not present

## 2017-05-01 DIAGNOSIS — I739 Peripheral vascular disease, unspecified: Secondary | ICD-10-CM | POA: Diagnosis not present

## 2017-05-01 DIAGNOSIS — L600 Ingrowing nail: Secondary | ICD-10-CM | POA: Diagnosis not present

## 2017-05-03 DIAGNOSIS — Z23 Encounter for immunization: Secondary | ICD-10-CM | POA: Diagnosis not present

## 2017-06-21 DIAGNOSIS — N184 Chronic kidney disease, stage 4 (severe): Secondary | ICD-10-CM | POA: Diagnosis not present

## 2017-06-21 DIAGNOSIS — Z6827 Body mass index (BMI) 27.0-27.9, adult: Secondary | ICD-10-CM | POA: Diagnosis not present

## 2017-06-21 DIAGNOSIS — Z299 Encounter for prophylactic measures, unspecified: Secondary | ICD-10-CM | POA: Diagnosis not present

## 2017-06-21 DIAGNOSIS — I10 Essential (primary) hypertension: Secondary | ICD-10-CM | POA: Diagnosis not present

## 2017-06-21 DIAGNOSIS — Z713 Dietary counseling and surveillance: Secondary | ICD-10-CM | POA: Diagnosis not present

## 2017-07-18 DIAGNOSIS — Z823 Family history of stroke: Secondary | ICD-10-CM | POA: Diagnosis not present

## 2017-07-18 DIAGNOSIS — D62 Acute posthemorrhagic anemia: Secondary | ICD-10-CM | POA: Diagnosis not present

## 2017-07-18 DIAGNOSIS — N179 Acute kidney failure, unspecified: Secondary | ICD-10-CM | POA: Diagnosis not present

## 2017-07-18 DIAGNOSIS — Z801 Family history of malignant neoplasm of trachea, bronchus and lung: Secondary | ICD-10-CM | POA: Diagnosis not present

## 2017-07-18 DIAGNOSIS — I129 Hypertensive chronic kidney disease with stage 1 through stage 4 chronic kidney disease, or unspecified chronic kidney disease: Secondary | ICD-10-CM | POA: Diagnosis present

## 2017-07-18 DIAGNOSIS — Z7982 Long term (current) use of aspirin: Secondary | ICD-10-CM | POA: Diagnosis not present

## 2017-07-18 DIAGNOSIS — N183 Chronic kidney disease, stage 3 (moderate): Secondary | ICD-10-CM | POA: Diagnosis present

## 2017-07-18 DIAGNOSIS — K921 Melena: Secondary | ICD-10-CM | POA: Diagnosis not present

## 2017-07-18 DIAGNOSIS — Z87891 Personal history of nicotine dependence: Secondary | ICD-10-CM | POA: Diagnosis not present

## 2017-07-18 DIAGNOSIS — R1013 Epigastric pain: Secondary | ICD-10-CM | POA: Diagnosis not present

## 2017-07-18 DIAGNOSIS — K922 Gastrointestinal hemorrhage, unspecified: Secondary | ICD-10-CM | POA: Diagnosis not present

## 2017-07-18 DIAGNOSIS — Z9049 Acquired absence of other specified parts of digestive tract: Secondary | ICD-10-CM | POA: Diagnosis not present

## 2017-07-18 DIAGNOSIS — R55 Syncope and collapse: Secondary | ICD-10-CM | POA: Diagnosis not present

## 2017-07-18 DIAGNOSIS — R42 Dizziness and giddiness: Secondary | ICD-10-CM | POA: Diagnosis not present

## 2017-07-18 DIAGNOSIS — I10 Essential (primary) hypertension: Secondary | ICD-10-CM | POA: Diagnosis not present

## 2017-07-18 DIAGNOSIS — I959 Hypotension, unspecified: Secondary | ICD-10-CM | POA: Diagnosis not present

## 2017-07-18 DIAGNOSIS — R079 Chest pain, unspecified: Secondary | ICD-10-CM | POA: Diagnosis not present

## 2017-07-18 DIAGNOSIS — N400 Enlarged prostate without lower urinary tract symptoms: Secondary | ICD-10-CM | POA: Diagnosis not present

## 2017-07-18 DIAGNOSIS — Z950 Presence of cardiac pacemaker: Secondary | ICD-10-CM | POA: Diagnosis not present

## 2017-07-31 DIAGNOSIS — Z6827 Body mass index (BMI) 27.0-27.9, adult: Secondary | ICD-10-CM | POA: Diagnosis not present

## 2017-07-31 DIAGNOSIS — Z299 Encounter for prophylactic measures, unspecified: Secondary | ICD-10-CM | POA: Diagnosis not present

## 2017-07-31 DIAGNOSIS — L03031 Cellulitis of right toe: Secondary | ICD-10-CM | POA: Diagnosis not present

## 2017-07-31 DIAGNOSIS — L03032 Cellulitis of left toe: Secondary | ICD-10-CM | POA: Diagnosis not present

## 2017-07-31 DIAGNOSIS — I10 Essential (primary) hypertension: Secondary | ICD-10-CM | POA: Diagnosis not present

## 2017-07-31 DIAGNOSIS — L600 Ingrowing nail: Secondary | ICD-10-CM | POA: Diagnosis not present

## 2017-07-31 DIAGNOSIS — N184 Chronic kidney disease, stage 4 (severe): Secondary | ICD-10-CM | POA: Diagnosis not present

## 2017-07-31 DIAGNOSIS — K219 Gastro-esophageal reflux disease without esophagitis: Secondary | ICD-10-CM | POA: Diagnosis not present

## 2017-07-31 DIAGNOSIS — I739 Peripheral vascular disease, unspecified: Secondary | ICD-10-CM | POA: Diagnosis not present

## 2017-07-31 DIAGNOSIS — K922 Gastrointestinal hemorrhage, unspecified: Secondary | ICD-10-CM | POA: Diagnosis not present

## 2017-08-04 DIAGNOSIS — Z45018 Encounter for adjustment and management of other part of cardiac pacemaker: Secondary | ICD-10-CM | POA: Diagnosis not present

## 2017-08-04 DIAGNOSIS — R001 Bradycardia, unspecified: Secondary | ICD-10-CM | POA: Diagnosis not present

## 2017-08-07 DIAGNOSIS — Z45018 Encounter for adjustment and management of other part of cardiac pacemaker: Secondary | ICD-10-CM | POA: Diagnosis not present

## 2017-08-07 DIAGNOSIS — R001 Bradycardia, unspecified: Secondary | ICD-10-CM | POA: Diagnosis not present

## 2017-08-15 DIAGNOSIS — H43813 Vitreous degeneration, bilateral: Secondary | ICD-10-CM | POA: Diagnosis not present

## 2017-08-15 DIAGNOSIS — H26491 Other secondary cataract, right eye: Secondary | ICD-10-CM | POA: Diagnosis not present

## 2017-08-15 DIAGNOSIS — H04123 Dry eye syndrome of bilateral lacrimal glands: Secondary | ICD-10-CM | POA: Diagnosis not present

## 2017-08-15 DIAGNOSIS — H26492 Other secondary cataract, left eye: Secondary | ICD-10-CM | POA: Diagnosis not present

## 2017-08-18 DIAGNOSIS — I10 Essential (primary) hypertension: Secondary | ICD-10-CM | POA: Diagnosis not present

## 2017-08-18 DIAGNOSIS — K219 Gastro-esophageal reflux disease without esophagitis: Secondary | ICD-10-CM | POA: Diagnosis not present

## 2017-08-18 DIAGNOSIS — K922 Gastrointestinal hemorrhage, unspecified: Secondary | ICD-10-CM | POA: Diagnosis not present

## 2017-08-18 DIAGNOSIS — Z299 Encounter for prophylactic measures, unspecified: Secondary | ICD-10-CM | POA: Diagnosis not present

## 2017-08-18 DIAGNOSIS — Z6828 Body mass index (BMI) 28.0-28.9, adult: Secondary | ICD-10-CM | POA: Diagnosis not present

## 2017-08-18 DIAGNOSIS — Z87891 Personal history of nicotine dependence: Secondary | ICD-10-CM | POA: Diagnosis not present

## 2017-08-18 DIAGNOSIS — N184 Chronic kidney disease, stage 4 (severe): Secondary | ICD-10-CM | POA: Diagnosis not present

## 2017-08-24 DIAGNOSIS — M159 Polyosteoarthritis, unspecified: Secondary | ICD-10-CM | POA: Diagnosis not present

## 2017-08-24 DIAGNOSIS — I10 Essential (primary) hypertension: Secondary | ICD-10-CM | POA: Diagnosis not present

## 2017-08-25 DIAGNOSIS — H26491 Other secondary cataract, right eye: Secondary | ICD-10-CM | POA: Diagnosis not present

## 2017-08-29 ENCOUNTER — Encounter: Payer: Self-pay | Admitting: Gastroenterology

## 2017-09-20 DIAGNOSIS — I10 Essential (primary) hypertension: Secondary | ICD-10-CM | POA: Diagnosis not present

## 2017-09-20 DIAGNOSIS — M159 Polyosteoarthritis, unspecified: Secondary | ICD-10-CM | POA: Diagnosis not present

## 2017-10-03 DIAGNOSIS — I10 Essential (primary) hypertension: Secondary | ICD-10-CM | POA: Diagnosis not present

## 2017-10-03 DIAGNOSIS — N184 Chronic kidney disease, stage 4 (severe): Secondary | ICD-10-CM | POA: Diagnosis not present

## 2017-10-03 DIAGNOSIS — Z299 Encounter for prophylactic measures, unspecified: Secondary | ICD-10-CM | POA: Diagnosis not present

## 2017-10-03 DIAGNOSIS — Z789 Other specified health status: Secondary | ICD-10-CM | POA: Diagnosis not present

## 2017-10-03 DIAGNOSIS — Z6827 Body mass index (BMI) 27.0-27.9, adult: Secondary | ICD-10-CM | POA: Diagnosis not present

## 2017-10-03 DIAGNOSIS — D649 Anemia, unspecified: Secondary | ICD-10-CM | POA: Diagnosis not present

## 2017-10-11 DIAGNOSIS — M159 Polyosteoarthritis, unspecified: Secondary | ICD-10-CM | POA: Diagnosis not present

## 2017-10-11 DIAGNOSIS — I10 Essential (primary) hypertension: Secondary | ICD-10-CM | POA: Diagnosis not present

## 2017-10-12 ENCOUNTER — Other Ambulatory Visit: Payer: Self-pay

## 2017-10-12 ENCOUNTER — Encounter: Payer: Self-pay | Admitting: Gastroenterology

## 2017-10-12 ENCOUNTER — Ambulatory Visit (INDEPENDENT_AMBULATORY_CARE_PROVIDER_SITE_OTHER): Payer: Medicare Other | Admitting: Gastroenterology

## 2017-10-12 DIAGNOSIS — K921 Melena: Secondary | ICD-10-CM

## 2017-10-12 DIAGNOSIS — D500 Iron deficiency anemia secondary to blood loss (chronic): Secondary | ICD-10-CM | POA: Insufficient documentation

## 2017-10-12 MED ORDER — PANTOPRAZOLE SODIUM 40 MG PO TBEC: 40.0000 mg | DELAYED_RELEASE_TABLET | Freq: Every day | ORAL | 11 refills | Status: DC

## 2017-10-12 NOTE — Patient Instructions (Signed)
STOP ASPIRIN.  CONTINUE IRON.  CONTINUE PROTONIX ONCE DAILY.   COMPLETE UPPER ENDOSCOPY IN 2-4 WEEKS.  PLEASE CALL WITH QUESTIONS OR CONCERNS.  FOLLOW UP IN THE OFFICE WILL BE SCHEDULED IF NEEDED AFTER ENDOSCOPY.

## 2017-10-12 NOTE — Addendum Note (Signed)
Addended by: Danie Binder on: 10/12/2017 11:15 AM   Modules accepted: Orders

## 2017-10-12 NOTE — Progress Notes (Signed)
Subjective:    Patient ID: Austin Jacobs, male    DOB: September 02, 1916, 84 y.o.   MRN: [id number]  Monico Blitz, MD   HPI GOT WOBBLY AND WENT TO ED ON CHRISTMAS DAY AND ALL OF SUDDEN HE WAS HAVING MENTAL STATUS CHANGES/SYNCOPE. THOUGHT HE HAD INTERNAL BLEEDING. Hb WENT DOWN TO 8.  IN DEC 2018 WAS HAVING HEARTBURN FOR 2-3 WEEKS BUT NO PROBLEMS SINCE. HAD BLACK STOOLS THEN AND NOT NOW. TENDS TO BE CONSTIPATED. MIRALAX HELPS BUT ONLY NEEDED ONE DOSE THIS YEAR.  DID NOT SCOPE HIM DUE TO HIS AGE AND PUT HIM ON PROTONIX IV AND DISCHARGED HIM ON PROTONIX AND THEN DR. [facility] PUT HIM ON IRON. DR. [facility] CHECKED AND IT WAS 10. NO BLOOD TRANSFUSIONS.  WALKS 3 TIMES AROUND [facility] WITHOUT A PROBLEM. BOWELS TEND TO BE REGULAR. BEEN A LITTLE SOB WHEN WALKING UPSTAIRS.  PT DENIES FEVER, CHILLS, HEMATOCHEZIA, HEMATEMESIS, nausea, vomiting, diarrhea, CHEST PAIN, SHORTNESS OF BREATH, CHANGE IN BOWEL IN HABITS, constipation, abdominal pain, problems swallowing, OR problems with sedation  Past Medical History:  Diagnosis Date  . BPH (benign prostatic hyperplasia)   . Chronic kidney disease   . Hypertension    Past Surgical History:  Procedure Laterality Date  . APPENDECTOMY    . COLONOSCOPY    . HERNIA REPAIR      No Known Allergies  Current Outpatient Medications  Medication Sig    . aliskiren (TEKTURNA) 150 MG tablet Take 150 mg by mouth daily.    .      . finasteride (PROSCAR) 5 MG tablet Take 1 tablet by mouth daily.    . iron polysaccharides (NIFEREX) 150 MG capsule Take 150 mg by mouth daily.    . Multiple Vitamins-Minerals (CENTRUM SILVER PO) Take 1 tablet by mouth daily.    . pantoprazole (PROTONIX) 40 MG tablet Take 40 mg by mouth 2 (two) times daily.    . tamsulosin (FLOMAX) 0.4 MG CAPS capsule Take 0.4 mg by mouth daily after breakfast.     .      . polyethylene glycol powder (GLYCOLAX/MIRALAX) powder Take 17 g by mouth daily as needed.     Family History  Problem Relation Age of Onset  .  Stroke Mother   . Lung cancer Father    Social History   Socioeconomic History  . Marital status: Widowed    Spouse name: Not on file  . Number of children: Not on file  . Years of education: Not on file  . Highest education level: Not on file  Occupational History  . Not on file  Social Needs  . Financial resource strain: Not on file  . Food insecurity:    Worry: Not on file    Inability: Not on file  . Transportation needs:    Medical: Not on file    Non-medical: Not on file  Tobacco Use  . Smoking status: Former Smoker    Packs/day: 0.50    Years: 22.00    Pack years: 11.00    Types: Cigarettes, Pipe    Start date: 07/26/1935    Last attempt to quit: 07/25/1957    Years since quitting: 60.2  . Smokeless tobacco: Never Used  Substance and Sexual Activity  . Alcohol use: Yes    Alcohol/week: 0.0 oz    Comment: Occasionally- few times a month  . Drug use: No  . Sexual activity: Not on file  Lifestyle  . Physical activity:    Days per week:  Not on file    Minutes per session: Not on file  . Stress: Not on file  Relationships  . Social connections:    Talks on phone: Not on file    Gets together: Not on file    Attends religious service: Not on file    Active member of club or organization: Not on file    Attends meetings of clubs or organizations: Not on file    Relationship status: Not on file  Other Topics Concern  . Not on file  Social History Narrative   HAS INSIGNIFICANT OTHER SINCE 2006. HAS 8 NUMBER ONES.   RETIRED: WORKED FOR [employer] FOR [employer].   BORN AND RAISED IN [place] AND LEFT SINCE 1939. WORKED [place], [place].    BROTHER KILLED IN [place].   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    Review of Systems SNEEZE AND HAS A RUNNY NOSE EVERY AM. PER HPI OTHERWISE ALL SYSTEMS ARE NEGATIVE.    Objective:   Physical Exam  Constitutional: He is oriented to person, place, and time. He appears well-developed and  well-nourished. No distress.  HENT:  Head: Normocephalic and atraumatic.  Mouth/Throat: Oropharynx is clear and moist. No oropharyngeal exudate.  Eyes: Pupils are equal, round, and reactive to light. No scleral icterus.  Neck: Normal range of motion. Neck supple.  Cardiovascular: Normal rate, regular rhythm and normal heart sounds.  Pulmonary/Chest: Effort normal and breath sounds normal. No respiratory distress.  Abdominal: Soft. Bowel sounds are normal. He exhibits no distension. There is no tenderness.  Musculoskeletal: He exhibits no edema.  WALKS ASSISTED WITH A CANE, ABLE TO GET ON EXAM TABLE WITHOUT ASSISTANCE  Lymphadenopathy:    He has no cervical adenopathy.  Neurological: He is alert and oriented to person, place, and time.  NO  NEW FOCAL DEFICITS, HARD OF HEARING  Psychiatric: He has a normal mood and affect.  Vitals reviewed.     Assessment & Plan:

## 2017-10-12 NOTE — H&P (View-Only) (Signed)
Subjective:    Patient ID: Austin Jacobs, male    DOB: Jul 18, 1917, 84 y.o.   MRN: [id number]  Monico Blitz, MD   HPI GOT WOBBLY AND WENT TO ED ON CHRISTMAS DAY AND ALL OF SUDDEN HE WAS HAVING MENTAL STATUS CHANGES/SYNCOPE. THOUGHT HE HAD INTERNAL BLEEDING. Hb WENT DOWN TO 8.  IN DEC 2018 WAS HAVING HEARTBURN FOR 2-3 WEEKS BUT NO PROBLEMS SINCE. HAD BLACK STOOLS THEN AND NOT NOW. TENDS TO BE CONSTIPATED. MIRALAX HELPS BUT ONLY NEEDED ONE DOSE THIS YEAR.  DID NOT SCOPE HIM DUE TO HIS AGE AND PUT HIM ON PROTONIX IV AND DISCHARGED HIM ON PROTONIX AND THEN DR. [facility] PUT HIM ON IRON. DR. [facility] CHECKED AND IT WAS 10. NO BLOOD TRANSFUSIONS.  WALKS 3 TIMES AROUND [facility] WITHOUT A PROBLEM. BOWELS TEND TO BE REGULAR. BEEN A LITTLE SOB WHEN WALKING UPSTAIRS.  PT DENIES FEVER, CHILLS, HEMATOCHEZIA, HEMATEMESIS, nausea, vomiting, diarrhea, CHEST PAIN, SHORTNESS OF BREATH, CHANGE IN BOWEL IN HABITS, constipation, abdominal pain, problems swallowing, OR problems with sedation  Past Medical History:  Diagnosis Date  . BPH (benign prostatic hyperplasia)   . Chronic kidney disease   . Hypertension    Past Surgical History:  Procedure Laterality Date  . APPENDECTOMY    . COLONOSCOPY    . HERNIA REPAIR      No Known Allergies  Current Outpatient Medications  Medication Sig    . aliskiren (TEKTURNA) 150 MG tablet Take 150 mg by mouth daily.    .      . finasteride (PROSCAR) 5 MG tablet Take 1 tablet by mouth daily.    . iron polysaccharides (NIFEREX) 150 MG capsule Take 150 mg by mouth daily.    . Multiple Vitamins-Minerals (CENTRUM SILVER PO) Take 1 tablet by mouth daily.    . pantoprazole (PROTONIX) 40 MG tablet Take 40 mg by mouth 2 (two) times daily.    . tamsulosin (FLOMAX) 0.4 MG CAPS capsule Take 0.4 mg by mouth daily after breakfast.     .      . polyethylene glycol powder (GLYCOLAX/MIRALAX) powder Take 17 g by mouth daily as needed.     Family History  Problem Relation Age of Onset  .  Stroke Mother   . Lung cancer Father    Social History   Socioeconomic History  . Marital status: Widowed    Spouse name: Not on file  . Number of children: Not on file  . Years of education: Not on file  . Highest education level: Not on file  Occupational History  . Not on file  Social Needs  . Financial resource strain: Not on file  . Food insecurity:    Worry: Not on file    Inability: Not on file  . Transportation needs:    Medical: Not on file    Non-medical: Not on file  Tobacco Use  . Smoking status: Former Smoker    Packs/day: 0.50    Years: 22.00    Pack years: 11.00    Types: Cigarettes, Pipe    Start date: 07/26/1935    Last attempt to quit: 07/25/1957    Years since quitting: 60.2  . Smokeless tobacco: Never Used  Substance and Sexual Activity  . Alcohol use: Yes    Alcohol/week: 0.0 oz    Comment: Occasionally- few times a month  . Drug use: No  . Sexual activity: Not on file  Lifestyle  . Physical activity:    Days per week:  Not on file    Minutes per session: Not on file  . Stress: Not on file  Relationships  . Social connections:    Talks on phone: Not on file    Gets together: Not on file    Attends religious service: Not on file    Active member of club or organization: Not on file    Attends meetings of clubs or organizations: Not on file    Relationship status: Not on file  Other Topics Concern  . Not on file  Social History Narrative   HAS INSIGNIFICANT OTHER SINCE 2006. HAS 8 NUMBER ONES.   RETIRED: WORKED FOR [employer] FOR THE [employer].   BORN AND RAISED IN [place] AND LEFT SINCE 1939. WORKED [place], [place].    BROTHER KILLED IN [place].   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    Review of Systems SNEEZE AND HAS A RUNNY NOSE EVERY AM. PER HPI OTHERWISE ALL SYSTEMS ARE NEGATIVE.    Objective:   Physical Exam  Constitutional: He is oriented to person, place, and time. He appears well-developed and  well-nourished. No distress.  HENT:  Head: Normocephalic and atraumatic.  Mouth/Throat: Oropharynx is clear and moist. No oropharyngeal exudate.  Eyes: Pupils are equal, round, and reactive to light. No scleral icterus.  Neck: Normal range of motion. Neck supple.  Cardiovascular: Normal rate, regular rhythm and normal heart sounds.  Pulmonary/Chest: Effort normal and breath sounds normal. No respiratory distress.  Abdominal: Soft. Bowel sounds are normal. He exhibits no distension. There is no tenderness.  Musculoskeletal: He exhibits no edema.  WALKS ASSISTED WITH A CANE, ABLE TO GET ON EXAM TABLE WITHOUT ASSISTANCE  Lymphadenopathy:    He has no cervical adenopathy.  Neurological: He is alert and oriented to person, place, and time.  NO  NEW FOCAL DEFICITS, HARD OF HEARING  Psychiatric: He has a normal mood and affect.  Vitals reviewed.     Assessment & Plan:

## 2017-10-12 NOTE — Assessment & Plan Note (Signed)
[facility] 2018 AND ASSOCIATED WITH SYNCOPE WHILE TAKING ASA AND NO PPI. ANEMIA PERSISTS BUT NO BRBPR OR MELENA. DIFFERENTIAL DIAGNOSIS INCLUDES: PUD, AVMs, DIEULAFOY'S LESION, LESS LIKELY Esophageal varices, GE JUNCTION TUMOR, GASTRIC CA.  STOP ASPIRIN. CONTINUE IRON. CONTINUE PROTONIX ONCE DAILY. OMPLETE UPPER ENDOSCOPY IN 2-4 WEEKS. DISCUSSED PROCEDURE, BENEFITS, & RISKS: < 1% chance of medication reaction, bleeding, OR perforation. PLEASE CALL WITH QUESTIONS OR CONCERNS. FOLLOW UP IN THE OFFICE WILL BE SCHEDULED IF NEEDED AFTER ENDOSCOPY.

## 2017-10-12 NOTE — Progress Notes (Signed)
cc'ed to pcp °

## 2017-10-24 ENCOUNTER — Encounter (HOSPITAL_COMMUNITY)
Admission: RE | Disposition: A | Discharge status: Home / Self Care | Payer: Self-pay | Source: Ambulatory Visit | Attending: Gastroenterology

## 2017-10-24 ENCOUNTER — Encounter (HOSPITAL_COMMUNITY): Payer: Self-pay | Admitting: Anesthesiology

## 2017-10-24 ENCOUNTER — Ambulatory Visit (HOSPITAL_COMMUNITY)
Admission: RE | Admit: 2017-10-24 | Discharge: 2017-10-24 | Disposition: A | Discharge status: Home / Self Care | Payer: Medicare Other | Source: Ambulatory Visit | Attending: Gastroenterology | Admitting: Gastroenterology

## 2017-10-24 ENCOUNTER — Encounter (HOSPITAL_COMMUNITY): Payer: Self-pay | Admitting: *Deleted

## 2017-10-24 ENCOUNTER — Other Ambulatory Visit: Payer: Self-pay

## 2017-10-24 DIAGNOSIS — K2950 Unspecified chronic gastritis without bleeding: Secondary | ICD-10-CM | POA: Diagnosis not present

## 2017-10-24 DIAGNOSIS — K2970 Gastritis, unspecified, without bleeding: Secondary | ICD-10-CM

## 2017-10-24 DIAGNOSIS — Z79899 Other long term (current) drug therapy: Secondary | ICD-10-CM | POA: Insufficient documentation

## 2017-10-24 DIAGNOSIS — I129 Hypertensive chronic kidney disease with stage 1 through stage 4 chronic kidney disease, or unspecified chronic kidney disease: Secondary | ICD-10-CM | POA: Insufficient documentation

## 2017-10-24 DIAGNOSIS — K2951 Unspecified chronic gastritis with bleeding: Secondary | ICD-10-CM | POA: Diagnosis not present

## 2017-10-24 DIAGNOSIS — N189 Chronic kidney disease, unspecified: Secondary | ICD-10-CM | POA: Diagnosis not present

## 2017-10-24 DIAGNOSIS — Z87891 Personal history of nicotine dependence: Secondary | ICD-10-CM | POA: Insufficient documentation

## 2017-10-24 DIAGNOSIS — N400 Enlarged prostate without lower urinary tract symptoms: Secondary | ICD-10-CM | POA: Insufficient documentation

## 2017-10-24 DIAGNOSIS — K311 Adult hypertrophic pyloric stenosis: Secondary | ICD-10-CM | POA: Diagnosis not present

## 2017-10-24 DIAGNOSIS — K921 Melena: Secondary | ICD-10-CM | POA: Diagnosis not present

## 2017-10-24 HISTORY — PX: ESOPHAGOGASTRODUODENOSCOPY: SHX5428

## 2017-10-24 HISTORY — DX: Presence of cardiac pacemaker: Z95.0

## 2017-10-24 LAB — CBC
HCT: 31.9 % — ABNORMAL LOW (ref 39.0–52.0)
Hemoglobin: 10.3 g/dL — ABNORMAL LOW (ref 13.0–17.0)
MCH: 29.4 pg (ref 26.0–34.0)
MCHC: 32.3 g/dL (ref 30.0–36.0)
MCV: 91.1 fL (ref 78.0–100.0)
PLATELETS: 250 10*3/uL (ref 150–400)
RBC: 3.5 MIL/uL — ABNORMAL LOW (ref 4.22–5.81)
RDW: 13.2 % (ref 11.5–15.5)
WBC: 5.3 10*3/uL (ref 4.0–10.5)

## 2017-10-24 LAB — FERRITIN: FERRITIN: 10 ng/mL — AB (ref 24–336)

## 2017-10-24 SURGERY — EGD (ESOPHAGOGASTRODUODENOSCOPY)
Anesthesia: Moderate Sedation

## 2017-10-24 MED ORDER — MIDAZOLAM HCL 5 MG/5ML IJ SOLN
INTRAMUSCULAR | Status: AC
  Filled 2017-10-24: qty 5

## 2017-10-24 MED ORDER — LIDOCAINE VISCOUS 2 % MT SOLN
OROMUCOSAL | Status: AC
  Filled 2017-10-24: qty 15

## 2017-10-24 MED ORDER — MEPERIDINE HCL 100 MG/ML IJ SOLN
INTRAMUSCULAR | Status: DC | PRN
  Administered 2017-10-24: 25 mg

## 2017-10-24 MED ORDER — LIDOCAINE VISCOUS 2 % MT SOLN
OROMUCOSAL | Status: DC | PRN
  Administered 2017-10-24: 1 via OROMUCOSAL

## 2017-10-24 MED ORDER — POLYSACCHARIDE IRON COMPLEX 150 MG PO CAPS: 150.0000 mg | ORAL_CAPSULE | Freq: Every day | ORAL | 5 refills | Status: DC

## 2017-10-24 MED ORDER — MEPERIDINE HCL 100 MG/ML IJ SOLN
INTRAMUSCULAR | Status: AC
  Filled 2017-10-24: qty 1

## 2017-10-24 MED ORDER — MIDAZOLAM HCL 5 MG/5ML IJ SOLN
INTRAMUSCULAR | Status: DC | PRN
  Administered 2017-10-24 (×2): 1 mg via INTRAVENOUS

## 2017-10-24 MED ORDER — SODIUM CHLORIDE 0.9 % IV SOLN
INTRAVENOUS | Status: DC
  Administered 2017-10-24: 14:00:00 via INTRAVENOUS

## 2017-10-24 NOTE — Discharge Instructions (Signed)
You have mild gastritis. I biopsied your stomach.   We will check YOUR BLOOD COUNT AND IRON STORES today.   CONTINUE IRON.  PROTONIX ONCE DAILY IS SUFFICIENT FOR NOW.   YOUR BIOPSY WILL BE BACK IN 7 DAYS.  FOLLOW UP IN 4 MOS.  UPPER ENDOSCOPY AFTER CARE Read the instructions outlined below and refer to this sheet in the next week. These discharge instructions provide you with general information on caring for yourself after you leave the hospital. While your treatment has been planned according to the most current medical practices available, unavoidable complications occasionally occur. If you have any problems or questions after discharge, call DR. Amaad Byers, [phone].  ACTIVITY  You may resume your regular activity, but move at a slower pace for the next 24 hours.   Take frequent rest periods for the next 24 hours.   Walking will help get rid of the air and reduce the bloated feeling in your belly (abdomen).   No driving for 24 hours (because of the medicine (anesthesia) used during the test).   You may shower.   Do not sign any important legal documents or operate any machinery for 24 hours (because of the anesthesia used during the test).    NUTRITION  Drink plenty of fluids.   You may resume your normal diet as instructed by your doctor.   Begin with a light meal and progress to your normal diet. Heavy or fried foods are harder to digest and may make you feel sick to your stomach (nauseated).   Avoid alcoholic beverages for 24 hours or as instructed.    MEDICATIONS  You may resume your normal medications.   WHAT YOU CAN EXPECT TODAY  Some feelings of bloating in the abdomen.   Passage of more gas than usual.    IF YOU HAD A BIOPSY TAKEN DURING THE UPPER ENDOSCOPY:  Eat a soft diet IF YOU HAVE NAUSEA, BLOATING, ABDOMINAL PAIN, OR VOMITING.    FINDING OUT THE RESULTS OF YOUR TEST Not all test results are available during your visit. DR. Oneida Alar WILL  CALL YOU WITHIN 7 DAYS OF YOUR PROCEDUE WITH YOUR RESULTS. Do not assume everything is normal if you have not heard from DR. Ritta Hammes IN ONE WEEK, CALL HER OFFICE AT [phone].  SEEK IMMEDIATE MEDICAL ATTENTION AND CALL THE OFFICE: [phone] IF:  You have more than a spotting of blood in your stool.   Your belly is swollen (abdominal distention).   You are nauseated or vomiting.   You have a temperature over 101F.   You have abdominal pain or discomfort that is severe or gets worse throughout the day.   Gastritis  Gastritis is an inflammation (the body's way of reacting to injury and/or infection) of the stomach. It is often caused by viral or bacterial (germ) infections. It can also be caused BY ASPIRIN, BC/GOODY POWDER'S, (IBUPROFEN) MOTRIN, OR ALEVE (NAPROXEN), chemicals (including alcohol), SPICY FOODS, and medications. This illness may be associated with generalized malaise (feeling tired, not well), UPPER ABDOMINAL STOMACH cramps, and fever. One common bacterial cause of gastritis is an organism known as H. Pylori. This can be treated with antibiotics.

## 2017-10-24 NOTE — Op Note (Signed)
[facility] Patient Name: Austin Jacobs Procedure Date: 10/24/2017 2:52 PM MRN: [id number] Date of Birth: Aug 17, 1916 Attending MD: Barney Drain MD, MD CSN: [id number] Age: 84 Admit Type: Outpatient Procedure:                Upper GI endoscopy WITH COLD FORCEPS BIOPSY Indications:              Melena Providers:                Barney Drain MD, MD, Janeece Riggers, RN, Nelma Rothman,                            Technician Referring MD:             Fuller Canada Manuella Ghazi MD, MD Medicines:                Meperidine 25 mg IV, Midazolam 2 mg IV Complications:            No immediate complications. Estimated Blood Loss:     Estimated blood loss was minimal. Procedure:                Pre-Anesthesia Assessment:                           - Prior to the procedure, a History and Physical                            was performed, and patient medications and                            allergies were reviewed. The patient's tolerance of                            previous anesthesia was also reviewed. The risks                            and benefits of the procedure and the sedation                            options and risks were discussed with the patient.                            All questions were answered, and informed consent                            was obtained. Prior Anticoagulants: The patient has                            taken no previous anticoagulant or antiplatelet                            agents. ASA Grade Assessment: II - A patient with                            mild systemic disease. After reviewing the risks  and benefits, the patient was deemed in                            satisfactory condition to undergo the procedure.                            After obtaining informed consent, the endoscope was                            passed under direct vision. Throughout the                            procedure, the patient's blood pressure, pulse, and                             oxygen saturations were monitored continuously. The                            [id number] ([id number]) scope was introduced through the                            mouth, and advanced to the second part of duodenum.                            The upper GI endoscopy was accomplished without                            difficulty. The patient tolerated the procedure                            well. Scope In: 3:19:11 PM Scope Out: 3:25:27 PM Total Procedure Duration: 0 hours 6 minutes 16 seconds  Findings:      The examined esophagus was normal.      Diffuse mild inflammation characterized by congestion (edema), erythema       and friability was found in the gastric body and in the gastric antrum.       Biopsies(3: BODY, 3: ANTRUM) were taken with a cold forceps for       Helicobacter pylori testing.      A benign-appearing, intrinsic mild stenosis was found at the pylorus.       This was traversed.      The examined duodenum was normal. Impression:               - MILD Gastritis/GASTROPATHY. Biopsied.                           - Gastric stenosis was found at the pylorus. Moderate Sedation:      Moderate (conscious) sedation was administered by the endoscopy nurse       and supervised by the endoscopist. The following parameters were       monitored: oxygen saturation, heart rate, blood pressure, and response       to care. Total physician intraservice time was 16 minutes. Recommendation:           - Await pathology results.                           -  Resume previous diet.                           - Continue present medications.                           - Return to my office in 4 months.                           - Patient has a contact number available for                            emergencies. The signs and symptoms of potential                            delayed complications were discussed with the                            patient. Return to normal activities tomorrow.                             Written discharge instructions were provided to the                            patient. Procedure Code(s):        --- Professional ---                           [phone], Esophagogastroduodenoscopy, flexible,                            transoral; with biopsy, single or multiple                           G0500, Moderate sedation services provided by the                            same physician or other qualified health care                            professional performing a gastrointestinal                            endoscopic service that sedation supports,                            requiring the presence of an independent trained                            observer to assist in the monitoring of the                            patient's level of consciousness and physiological                            status; initial 15  minutes of intra-service time;                            patient age 18 years or older (additional time may                            be reported with [phone], as appropriate) Diagnosis Code(s):        --- Professional ---                           K29.70, Gastritis, unspecified, without bleeding                           K31.1, Adult hypertrophic pyloric stenosis                           K92.1, Melena (includes Hematochezia) CPT copyright 2017 American Medical Association. All rights reserved. The codes documented in this report are preliminary and upon coder review may  be revised to meet current compliance requirements. Barney Drain, MD Barney Drain MD, MD 10/24/2017 3:39:06 PM This report has been signed electronically. Number of Addenda: 0

## 2017-10-24 NOTE — Interval H&P Note (Signed)
History and Physical Interval Note:  10/24/2017 3:06 PM  Austin Jacobs  has presented today for surgery, with the diagnosis of melena  The various methods of treatment have been discussed with the patient and family. After consideration of risks, benefits and other options for treatment, the patient has consented to  Procedure(s) with comments: ESOPHAGOGASTRODUODENOSCOPY (EGD) (N/A) - 2:45pm as a surgical intervention .  The patient's history has been reviewed, patient examined, no change in status, stable for surgery.  I have reviewed the patient's chart and labs.  Questions were answered to the patient's satisfaction.     [employer]

## 2017-10-26 ENCOUNTER — Telehealth: Payer: Self-pay | Admitting: Gastroenterology

## 2017-10-26 NOTE — Telephone Encounter (Signed)
Pt's friend, Daine Floras, is aware.

## 2017-10-26 NOTE — Telephone Encounter (Signed)
PLEASE CALL PT. HIS Hb IS 10.3 AND HIS FERRITIN IS 10. HE SHOULD CONTINUE THE IRON PILLS. WE WILL CALL HIM WITH HIS BIOPSY REPORT WHEN IT IS AVAILABLE.

## 2017-10-30 ENCOUNTER — Encounter (HOSPITAL_COMMUNITY): Payer: Self-pay | Admitting: Gastroenterology

## 2017-10-30 ENCOUNTER — Other Ambulatory Visit: Payer: Self-pay

## 2017-10-30 DIAGNOSIS — R7989 Other specified abnormal findings of blood chemistry: Secondary | ICD-10-CM

## 2017-10-30 NOTE — Progress Notes (Signed)
Pt and Susie are aware. Lab orders on file for 02/12/2018.

## 2017-10-30 NOTE — Progress Notes (Signed)
LMOM for a return call.  

## 2017-11-06 DIAGNOSIS — R001 Bradycardia, unspecified: Secondary | ICD-10-CM | POA: Diagnosis not present

## 2017-11-06 DIAGNOSIS — Z45018 Encounter for adjustment and management of other part of cardiac pacemaker: Secondary | ICD-10-CM | POA: Diagnosis not present

## 2017-11-07 DIAGNOSIS — R001 Bradycardia, unspecified: Secondary | ICD-10-CM | POA: Diagnosis not present

## 2017-11-07 DIAGNOSIS — Z45018 Encounter for adjustment and management of other part of cardiac pacemaker: Secondary | ICD-10-CM | POA: Diagnosis not present

## 2017-11-27 DIAGNOSIS — L03031 Cellulitis of right toe: Secondary | ICD-10-CM | POA: Diagnosis not present

## 2017-11-27 DIAGNOSIS — I739 Peripheral vascular disease, unspecified: Secondary | ICD-10-CM | POA: Diagnosis not present

## 2017-11-27 DIAGNOSIS — L600 Ingrowing nail: Secondary | ICD-10-CM | POA: Diagnosis not present

## 2017-11-27 DIAGNOSIS — L03032 Cellulitis of left toe: Secondary | ICD-10-CM | POA: Diagnosis not present

## 2017-12-17 ENCOUNTER — Emergency Department (HOSPITAL_COMMUNITY)
Admission: EM | Admit: 2017-12-17 | Discharge: 2017-12-17 | Disposition: A | Discharge status: Home / Self Care | Payer: Medicare Other | Attending: Emergency Medicine | Admitting: Emergency Medicine

## 2017-12-17 ENCOUNTER — Encounter (HOSPITAL_COMMUNITY): Payer: Self-pay | Admitting: Emergency Medicine

## 2017-12-17 DIAGNOSIS — R404 Transient alteration of awareness: Secondary | ICD-10-CM | POA: Diagnosis not present

## 2017-12-17 DIAGNOSIS — Z950 Presence of cardiac pacemaker: Secondary | ICD-10-CM | POA: Insufficient documentation

## 2017-12-17 DIAGNOSIS — N189 Chronic kidney disease, unspecified: Secondary | ICD-10-CM | POA: Diagnosis not present

## 2017-12-17 DIAGNOSIS — I129 Hypertensive chronic kidney disease with stage 1 through stage 4 chronic kidney disease, or unspecified chronic kidney disease: Secondary | ICD-10-CM | POA: Diagnosis not present

## 2017-12-17 DIAGNOSIS — Z79899 Other long term (current) drug therapy: Secondary | ICD-10-CM | POA: Insufficient documentation

## 2017-12-17 DIAGNOSIS — R55 Syncope and collapse: Secondary | ICD-10-CM

## 2017-12-17 DIAGNOSIS — Z87891 Personal history of nicotine dependence: Secondary | ICD-10-CM | POA: Insufficient documentation

## 2017-12-17 LAB — URINALYSIS, ROUTINE W REFLEX MICROSCOPIC
BILIRUBIN URINE: NEGATIVE
GLUCOSE, UA: NEGATIVE mg/dL
HGB URINE DIPSTICK: NEGATIVE
Ketones, ur: NEGATIVE mg/dL
Leukocytes, UA: NEGATIVE
Nitrite: NEGATIVE
PROTEIN: NEGATIVE mg/dL
Specific Gravity, Urine: 1.017 (ref 1.005–1.030)
pH: 5 (ref 5.0–8.0)

## 2017-12-17 LAB — BASIC METABOLIC PANEL
Anion gap: 9 (ref 5–15)
BUN: 35 mg/dL — AB (ref 6–20)
CALCIUM: 8.7 mg/dL — AB (ref 8.9–10.3)
CHLORIDE: 107 mmol/L (ref 101–111)
CO2: 25 mmol/L (ref 22–32)
CREATININE: 2.33 mg/dL — AB (ref 0.61–1.24)
GFR calc non Af Amer: 22 mL/min — ABNORMAL LOW (ref >60)
GFR, EST AFRICAN AMERICAN: 25 mL/min — AB (ref >60)
Glucose, Bld: 151 mg/dL — ABNORMAL HIGH (ref 65–99)
Potassium: 4.4 mmol/L (ref 3.5–5.1)
Sodium: 141 mmol/L (ref 135–145)

## 2017-12-17 LAB — CBC
HCT: 34.1 % — ABNORMAL LOW (ref 39.0–52.0)
Hemoglobin: 10.9 g/dL — ABNORMAL LOW (ref 13.0–17.0)
MCH: 28.2 pg (ref 26.0–34.0)
MCHC: 32 g/dL (ref 30.0–36.0)
MCV: 88.1 fL (ref 78.0–100.0)
PLATELETS: 269 10*3/uL (ref 150–400)
RBC: 3.87 MIL/uL — AB (ref 4.22–5.81)
RDW: 15.6 % — ABNORMAL HIGH (ref 11.5–15.5)
WBC: 5.5 10*3/uL (ref 4.0–10.5)

## 2017-12-17 NOTE — ED Triage Notes (Signed)
[place].  States he was standing and began feeling very badly and had to sit down.  Was unable to talk other than yes or no for a few minutes.  States he feels much better since cooling off.

## 2017-12-17 NOTE — ED Notes (Signed)
Pt was informed that we need a urine sample. Pt states that he can not urinate at this time. 

## 2017-12-17 NOTE — ED Provider Notes (Signed)
[facility] EMERGENCY DEPARTMENT Provider Note   CSN: [id number] Arrival date & time: 12/17/17  1517     History   Chief Complaint Chief Complaint  Patient presents with  . Loss of Consciousness    HPI Austin Jacobs is a 84 y.o. male.  HPI Patient presents after a near syncopal episode.  He was at a memorial day event today and was outside in the hot air.  While he was standing up he began to feel lightheaded and almost passed out.  Sat down and was confused with the episode.  Had difficulty answering anything except yes or no.  No chest pain.  No trouble breathing.  He had fought in World War II.  States he feels much better now.  States he may not be able to do the events tomorrow.  States that he has been doing well the last couple days.  States he tries to walk for 30 minutes during the weekdays at [facility].  Was visited while in the ER by [facility]. Past Medical History:  Diagnosis Date  . BPH (benign prostatic hyperplasia)   . Chronic kidney disease   . Hypertension   . Presence of permanent cardiac pacemaker     Patient Active Problem List   Diagnosis Date Noted  . Melena 10/12/2017  . Chest pain 05/09/2013  . HTN (hypertension) 05/09/2013  . 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 APPENDECTOMY    . COLONOSCOPY    . ESOPHAGOGASTRODUODENOSCOPY N/A 10/24/2017   Procedure: ESOPHAGOGASTRODUODENOSCOPY (EGD);  Surgeon: Danie Binder, MD;  Location: [facility] ENDO SUITE;  Service: Endoscopy;  Laterality: N/A;  2:45pm  . HERNIA REPAIR    . PACEMAKER IMPLANT          Home Medications    Prior to Admission medications   Medication Sig Start Date End Date Taking? Authorizing Provider  aliskiren (TEKTURNA) 150 MG tablet Take 150 mg by mouth daily.   Yes [provider]  amoxicillin (AMOXIL) 500 MG capsule Take 2,000 mg by mouth as directed. One hour prior to dental appointments  01/19/15  Yes [provider]  erythromycin ophthalmic ointment 1 application See admin instructions. Applied inside the bottom lower eyelids as directed daily 12/01/17  Yes [provider]  finasteride (PROSCAR) 5 MG tablet Take 1 tablet by mouth daily. 01/24/15  Yes [provider]  iron polysaccharides (NIFEREX) 150 MG capsule Take 1 capsule (150 mg total) by mouth daily. 10/24/17  Yes Fields, Marga Melnick, MD  Multiple Vitamins-Minerals (CENTRUM SILVER PO) Take 1 tablet by mouth daily.   Yes [provider]  pantoprazole (PROTONIX) 40 MG tablet Take 1 tablet (40 mg total) by mouth daily. 10/12/17  Yes Fields, Sandi L, MD  polyethylene glycol powder (GLYCOLAX/MIRALAX) powder Take 17 g by mouth daily as needed. 04/04/13  Yes [provider]  tamsulosin (FLOMAX) 0.4 MG CAPS capsule Take 0.4 mg by mouth daily after breakfast.    Yes [provider]    Family History Family History  Problem Relation Age of Onset  . Stroke Mother   . Lung cancer Father   . Colon cancer Neg Hx   . Colon polyps Neg Hx     Social History Social History   Tobacco Use  . Smoking status: Former Smoker    Packs/day: 0.50    Years: 22.00    Pack years: 11.00    Types: Cigarettes,  Pipe    Start date: 07/26/1935    Last attempt to quit: 07/25/1957    Years since quitting: 60.4  . Smokeless tobacco: Never Used  Substance Use Topics  . Alcohol use: Yes    Alcohol/week: 0.0 oz    Comment: Occasionally- few times a month  . Drug use: No     Allergies   Patient has no known allergies.   Review of Systems Review of Systems  Constitutional: Negative for appetite change and fever.  HENT: Negative for congestion.   Respiratory: Negative for shortness of breath.   Cardiovascular: Negative for chest pain.  Gastrointestinal: Negative for abdominal pain.  Genitourinary: Negative for flank pain.  Musculoskeletal: Negative for back pain.  Skin: Negative for rash.    Neurological: Positive for light-headedness.  Hematological: Negative for adenopathy.  Psychiatric/Behavioral: Negative for confusion.     Physical Exam Updated Vital Signs BP (!) 148/55   Pulse 61   Temp (!) 97.5 F (36.4 C) (Oral)   Resp 16   Ht 5\' 1"  (1.549 m)   Wt 70.3 kg (155 lb)   SpO2 100%   BMI 29.29 kg/m   Physical Exam  Constitutional: He appears well-developed.  HENT:  Head: Atraumatic.  Eyes: Pupils are equal, round, and reactive to light.  Cardiovascular: Normal rate.  Pulmonary/Chest:  Pacemaker left chest wall.  Abdominal: Soft. There is no tenderness.  Musculoskeletal: Normal range of motion.  Neurological: He is alert.  Skin: Skin is warm. Capillary refill takes less than 2 seconds.     ED Treatments / Results  Labs (all labs ordered are listed, but only abnormal results are displayed) Labs Reviewed  BASIC METABOLIC PANEL - Abnormal; Notable for the following components:      Result Value   Glucose, Bld 151 (*)    BUN 35 (*)    Creatinine, Ser 2.33 (*)    Calcium 8.7 (*)    GFR calc non Af Amer 22 (*)    GFR calc Af Amer 25 (*)    All other components within normal limits  CBC - Abnormal; Notable for the following components:   RBC 3.87 (*)    Hemoglobin 10.9 (*)    HCT 34.1 (*)    RDW 15.6 (*)    All other components within normal limits  URINALYSIS, ROUTINE W REFLEX MICROSCOPIC    EKG EKG Interpretation  Date/Time:  Sunday Dec 17 2017 15:21:54 EDT Ventricular Rate:  70 PR Interval:    QRS Duration: 148 QT Interval:  454 QTC Calculation: 490 R Axis:   -45 Text Interpretation:  Ventricular-paced rhythm No further analysis attempted due to paced rhythm Confirmed by Davonna Belling [phone]) on 12/17/2017 4:00:33 PM   Radiology No results found.  Procedures Procedures (including critical care time)  Medications Ordered in ED Medications - No data to display   Initial Impression / Assessment and Plan / ED Course  I have  reviewed the triage vital signs and the nursing notes.  Pertinent labs & imaging results that were available during my care of the patient were reviewed by me and considered in my medical decision making (see chart for details).     Patient with near syncope episode.  Had been outside in the heat.  Feels much better after IV fluids.  Labs reassuring.  Discharge home.  Final Clinical Impressions(s) / ED Diagnoses   Final diagnoses:  Near syncope    ED Discharge Orders    None  Davonna Belling, MD 12/17/17 2213

## 2017-12-17 NOTE — ED Notes (Signed)
Pt resting quietly.  No complaints at this time.  No distress.

## 2017-12-26 ENCOUNTER — Other Ambulatory Visit: Payer: Self-pay | Admitting: *Deleted

## 2017-12-26 ENCOUNTER — Encounter: Payer: Self-pay | Admitting: *Deleted

## 2017-12-26 DIAGNOSIS — M159 Polyosteoarthritis, unspecified: Secondary | ICD-10-CM | POA: Diagnosis not present

## 2017-12-26 DIAGNOSIS — R7989 Other specified abnormal findings of blood chemistry: Secondary | ICD-10-CM

## 2017-12-26 DIAGNOSIS — I10 Essential (primary) hypertension: Secondary | ICD-10-CM | POA: Diagnosis not present

## 2017-12-28 ENCOUNTER — Encounter: Payer: Self-pay | Admitting: Gastroenterology

## 2018-01-03 DIAGNOSIS — Z299 Encounter for prophylactic measures, unspecified: Secondary | ICD-10-CM | POA: Diagnosis not present

## 2018-01-03 DIAGNOSIS — Z6828 Body mass index (BMI) 28.0-28.9, adult: Secondary | ICD-10-CM | POA: Diagnosis not present

## 2018-01-03 DIAGNOSIS — N184 Chronic kidney disease, stage 4 (severe): Secondary | ICD-10-CM | POA: Diagnosis not present

## 2018-01-03 DIAGNOSIS — D649 Anemia, unspecified: Secondary | ICD-10-CM | POA: Diagnosis not present

## 2018-01-03 DIAGNOSIS — I10 Essential (primary) hypertension: Secondary | ICD-10-CM | POA: Diagnosis not present

## 2018-01-18 ENCOUNTER — Telehealth: Payer: Self-pay | Admitting: Gastroenterology

## 2018-01-18 NOTE — Telephone Encounter (Signed)
Pt was mailed a letter on 12/26/2017 by Mindy for pt to have labs done on 02/12/2018.

## 2018-01-18 NOTE — Telephone Encounter (Signed)
PATIENT SUPPOSED TO HAVE LABS PRIOR TO HIS NEXT OFFICE VISIT WITH [facility], I JUST SCHEDULED HIM FOR September

## 2018-01-23 NOTE — Telephone Encounter (Signed)
REVIEWED. CBC/FERRITIN ONE WEEK PRIOR TO NEXT OPV.

## 2018-01-24 NOTE — Telephone Encounter (Signed)
Pt is aware of his appt with Dr. Oneida Alar on 04/19/2018 at 10:00 AM. He is also aware he may wait and get the labs about one week prior to appt. He will call before then if he has questions or concerns.

## 2018-02-06 DIAGNOSIS — R001 Bradycardia, unspecified: Secondary | ICD-10-CM | POA: Diagnosis not present

## 2018-02-06 DIAGNOSIS — Z45018 Encounter for adjustment and management of other part of cardiac pacemaker: Secondary | ICD-10-CM | POA: Diagnosis not present

## 2018-02-09 DIAGNOSIS — R001 Bradycardia, unspecified: Secondary | ICD-10-CM | POA: Diagnosis not present

## 2018-02-09 DIAGNOSIS — Z45018 Encounter for adjustment and management of other part of cardiac pacemaker: Secondary | ICD-10-CM | POA: Diagnosis not present

## 2018-02-12 DIAGNOSIS — L03032 Cellulitis of left toe: Secondary | ICD-10-CM | POA: Diagnosis not present

## 2018-02-12 DIAGNOSIS — I739 Peripheral vascular disease, unspecified: Secondary | ICD-10-CM | POA: Diagnosis not present

## 2018-02-12 DIAGNOSIS — L03031 Cellulitis of right toe: Secondary | ICD-10-CM | POA: Diagnosis not present

## 2018-02-12 DIAGNOSIS — L600 Ingrowing nail: Secondary | ICD-10-CM | POA: Diagnosis not present

## 2018-02-20 ENCOUNTER — Encounter: Payer: Self-pay | Admitting: Family Medicine

## 2018-02-20 DIAGNOSIS — Z1331 Encounter for screening for depression: Secondary | ICD-10-CM | POA: Diagnosis not present

## 2018-02-20 DIAGNOSIS — Z1211 Encounter for screening for malignant neoplasm of colon: Secondary | ICD-10-CM | POA: Diagnosis not present

## 2018-02-20 DIAGNOSIS — Z0000 Encounter for general adult medical examination without abnormal findings: Secondary | ICD-10-CM | POA: Diagnosis not present

## 2018-02-20 DIAGNOSIS — Z1339 Encounter for screening examination for other mental health and behavioral disorders: Secondary | ICD-10-CM | POA: Diagnosis not present

## 2018-02-20 DIAGNOSIS — Z79899 Other long term (current) drug therapy: Secondary | ICD-10-CM | POA: Diagnosis not present

## 2018-02-20 DIAGNOSIS — Z125 Encounter for screening for malignant neoplasm of prostate: Secondary | ICD-10-CM | POA: Diagnosis not present

## 2018-02-20 DIAGNOSIS — Z7189 Other specified counseling: Secondary | ICD-10-CM | POA: Diagnosis not present

## 2018-02-20 DIAGNOSIS — I10 Essential (primary) hypertension: Secondary | ICD-10-CM | POA: Diagnosis not present

## 2018-02-20 DIAGNOSIS — E7800 Pure hypercholesterolemia, unspecified: Secondary | ICD-10-CM | POA: Diagnosis not present

## 2018-02-20 DIAGNOSIS — R5383 Other fatigue: Secondary | ICD-10-CM | POA: Diagnosis not present

## 2018-02-20 DIAGNOSIS — Z6827 Body mass index (BMI) 27.0-27.9, adult: Secondary | ICD-10-CM | POA: Diagnosis not present

## 2018-02-20 DIAGNOSIS — Z299 Encounter for prophylactic measures, unspecified: Secondary | ICD-10-CM | POA: Diagnosis not present

## 2018-02-22 DIAGNOSIS — R55 Syncope and collapse: Secondary | ICD-10-CM | POA: Diagnosis not present

## 2018-02-22 DIAGNOSIS — R001 Bradycardia, unspecified: Secondary | ICD-10-CM | POA: Diagnosis not present

## 2018-02-28 ENCOUNTER — Telehealth: Payer: Self-pay | Admitting: Gastroenterology

## 2018-02-28 NOTE — Telephone Encounter (Signed)
Pt brought a copy of his labs to [place] and is aware of his OV with SF in Sept.. Does he need to do more labs prior to OV? I laid what he brought in on DS desk.

## 2018-03-01 NOTE — Telephone Encounter (Signed)
PLEASE CALL PT. I PERSONALLY REVIEWED HI SLABS. HIS BLOOD COUNT IS IMPROVED TO 12.4. WHEN IT WAS LAST MEASURED IN OUR SYSTEM IN MAY 2019 IT WAS 10.4. NO ADDITIONAL LABS ARE NEEDED AT THIS TIME.

## 2018-03-01 NOTE — Telephone Encounter (Signed)
Forwarding to Dr. Oneida Alar and placing these lab orders on her desk.

## 2018-03-01 NOTE — Telephone Encounter (Signed)
LMOM to call.

## 2018-03-02 NOTE — Telephone Encounter (Signed)
PT is aware of results.  

## 2018-04-16 DIAGNOSIS — L03031 Cellulitis of right toe: Secondary | ICD-10-CM | POA: Diagnosis not present

## 2018-04-16 DIAGNOSIS — I739 Peripheral vascular disease, unspecified: Secondary | ICD-10-CM | POA: Diagnosis not present

## 2018-04-16 DIAGNOSIS — L600 Ingrowing nail: Secondary | ICD-10-CM | POA: Diagnosis not present

## 2018-04-16 DIAGNOSIS — L03032 Cellulitis of left toe: Secondary | ICD-10-CM | POA: Diagnosis not present

## 2018-04-19 ENCOUNTER — Encounter: Payer: Self-pay | Admitting: Gastroenterology

## 2018-04-19 ENCOUNTER — Ambulatory Visit (INDEPENDENT_AMBULATORY_CARE_PROVIDER_SITE_OTHER): Payer: Medicare Other | Admitting: Gastroenterology

## 2018-04-19 DIAGNOSIS — K921 Melena: Secondary | ICD-10-CM | POA: Diagnosis not present

## 2018-04-19 MED ORDER — POLYSACCHARIDE IRON COMPLEX 150 MG PO CAPS: 150.0000 mg | ORAL_CAPSULE | Freq: Every day | ORAL | 3 refills | Status: DC

## 2018-04-19 MED ORDER — PANTOPRAZOLE SODIUM 40 MG PO TBEC: 40.0000 mg | DELAYED_RELEASE_TABLET | Freq: Every day | ORAL | 3 refills | Status: AC

## 2018-04-19 NOTE — Progress Notes (Signed)
On recall  °

## 2018-04-19 NOTE — Patient Instructions (Signed)
DRINK WATER TO KEEP YOUR URINE LIGHT YELLOW.  FOLLOW A HIGH FIBER DIET. AVOID ITEMS THAT CAUSE BLOATING & GAS. SEE INFO BELOW.  CONTINUE iron and Protonix.  FOLLOW UP IN 1 YEAR.   High-Fiber Diet A high-fiber diet changes your normal diet to include more whole grains, legumes, fruits, and vegetables. Changes in the diet involve replacing refined carbohydrates with unrefined foods. The calorie level of the diet is essentially unchanged. The Dietary Reference Intake (recommended amount) for adult males is 38 grams per day. For adult females, it is 25 grams per day. Pregnant and lactating women should consume 28 grams of fiber per day. Fiber is the intact part of a plant that is not broken down during digestion. Functional fiber is fiber that has been isolated from the plant to provide a beneficial effect in the body. PURPOSE  Increase stool bulk.   Ease and regulate bowel movements.   Lower cholesterol.   REDUCE RISK OF COLON CANCER  INDICATIONS THAT YOU NEED MORE FIBER  Constipation and hemorrhoids.   Uncomplicated diverticulosis (intestine condition) and irritable bowel syndrome.   Weight management.   As a protective measure against hardening of the arteries (atherosclerosis), diabetes, and cancer.   GUIDELINES FOR INCREASING FIBER IN THE DIET  Start adding fiber to the diet slowly. A gradual increase of about 5 more grams (2 slices of whole-wheat bread, 2 servings of most fruits or vegetables, or 1 bowl of high-fiber cereal) per day is best. Too rapid an increase in fiber may result in constipation, flatulence, and bloating.   Drink enough water and fluids to keep your urine clear or pale yellow. Water, juice, or caffeine-free drinks are recommended. Not drinking enough fluid may cause constipation.   Eat a variety of high-fiber foods rather than one type of fiber.   Try to increase your intake of fiber through using high-fiber foods rather than fiber pills or supplements  that contain small amounts of fiber.   The goal is to change the types of food eaten. Do not supplement your present diet with high-fiber foods, but replace foods in your present diet.   INCLUDE A VARIETY OF FIBER SOURCES  Replace refined and processed grains with whole grains, canned fruits with fresh fruits, and incorporate other fiber sources. White rice, white breads, and most bakery goods contain little or no fiber.   Brown whole-grain rice, buckwheat oats, and many fruits and vegetables are all good sources of fiber. These include: broccoli, Brussels sprouts, cabbage, cauliflower, beets, sweet potatoes, white potatoes (skin on), carrots, tomatoes, eggplant, squash, berries, fresh fruits, and dried fruits.   Cereals appear to be the richest source of fiber. Cereal fiber is found in whole grains and bran. Bran is the fiber-rich outer coat of cereal grain, which is largely removed in refining. In whole-grain cereals, the bran remains. In breakfast cereals, the largest amount of fiber is found in those with "bran" in their names. The fiber content is sometimes indicated on the label.   You may need to include additional fruits and vegetables each day.   In baking, for 1 cup white flour, you may use the following substitutions:   1 cup whole-wheat flour minus 2 tablespoons.   1/2 cup white flour plus 1/2 cup whole-wheat flour.

## 2018-04-19 NOTE — Progress Notes (Signed)
cc'ed to pcp °

## 2018-04-19 NOTE — Progress Notes (Signed)
   Subjective:    Patient ID: Austin Jacobs, male    DOB: 1916/12/23, 84 y.o.   MRN: [id number]  Monico Blitz, MD   HPI Planning a trip to [place] to his old family home/farm. BMs: USUALLY REGULAR. PASSED OUT MAY 26.    PT DENIES FEVER, CHILLS, HEMATOCHEZIA, HEMATEMESIS, nausea, vomiting, melena, diarrhea, CHEST PAIN, SHORTNESS OF BREATH,  CHANGE IN BOWEL IN HABITS, constipation, abdominal pain, problems swallowing, OR heartburn or indigestion.  Past Medical History:  Diagnosis Date  . BPH (benign prostatic hyperplasia)   . Chronic kidney disease   . Hypertension   . Presence of permanent cardiac pacemaker    Past Surgical History:  Procedure Laterality Date  . APPENDECTOMY    . COLONOSCOPY    . ESOPHAGOGASTRODUODENOSCOPY N/A 10/24/2017   Procedure: ESOPHAGOGASTRODUODENOSCOPY (EGD);  Surgeon: Danie Binder, MD;  Location: [facility] ENDO [facility];  Service: Endoscopy;  Laterality: N/A;  2:45pm  . HERNIA REPAIR    . PACEMAKER IMPLANT     No Known Allergies  Current Outpatient Medications  Medication Sig    . aliskiren (TEKTURNA) 150 MG tablet Take 150 mg by mouth daily.    .      . finasteride (PROSCAR) 5 MG tablet Take 1 tablet by mouth daily.    . iron polysaccharides (NIFEREX) 150 MG capsule Take 1 capsule (150 mg total) by mouth daily.    . (CENTRUM SILVER PO) Take 1 tablet by mouth daily.    . pantoprazole (PROTONIX) 40 MG tablet Take 1 tablet (40 mg total) by mouth daily.    .  (GLYCOLAX/MIRALAX) powder Take 17 g by mouth daily as needed.    . Polyvinyl Alcohol-Povidone (REFRESH OP) Apply to eye at bedtime.    . tamsulosin (FLOMAX) 0.4 MG CAPS capsule Take 0.8 mg by mouth daily.     Marland Kitchen erythromycin ophthalmic ointment 1 application See admin instructions. Applied inside the bottom lower eyelids as directed daily     Review of Systems PER HPI OTHERWISE ALL SYSTEMS ARE NEGATIVE.    Objective:   Physical Exam  Constitutional: He is oriented to person, place, and time.  He appears well-developed and well-nourished. No distress.  HENT:  Head: Normocephalic and atraumatic.  Mouth/Throat: Oropharynx is clear and moist. No oropharyngeal exudate.  Eyes: Pupils are equal, round, and reactive to light. No scleral icterus.  Neck: Normal range of motion. Neck supple.  Cardiovascular: Normal rate, regular rhythm and normal heart sounds.  Pulmonary/Chest: Effort normal and breath sounds normal. No respiratory distress.  Abdominal: Soft. Bowel sounds are normal. He exhibits no distension. There is no tenderness.  Musculoskeletal: He exhibits no edema.  Lymphadenopathy:    He has no cervical adenopathy.  Neurological: He is alert and oriented to person, place, and time.  NO  NEW FOCAL DEFICITS  Psychiatric: He has a normal mood and affect.  Vitals reviewed.     Assessment & Plan:

## 2018-04-19 NOTE — Assessment & Plan Note (Signed)
SYMPTOMS CONTROLLED/RESOLVED.  CHECK CBC/FERRITIN. CONTINUE IRON AND PROTONIX. FOLLOW UP IN 1 YEAR.

## 2018-04-20 LAB — CBC WITH DIFFERENTIAL/PLATELET
BASOS ABS: 51 {cells}/uL (ref 0–200)
Basophils Relative: 0.8 %
Eosinophils Absolute: 154 cells/uL (ref 15–500)
Eosinophils Relative: 2.4 %
HEMATOCRIT: 35.6 % — AB (ref 38.5–50.0)
Hemoglobin: 12 g/dL — ABNORMAL LOW (ref 13.2–17.1)
LYMPHS ABS: 1850 {cells}/uL (ref 850–3900)
MCH: 30.3 pg (ref 27.0–33.0)
MCHC: 33.7 g/dL (ref 32.0–36.0)
MCV: 89.9 fL (ref 80.0–100.0)
MPV: 10.1 fL (ref 7.5–12.5)
Monocytes Relative: 10.5 %
Neutro Abs: 3674 cells/uL (ref 1500–7800)
Neutrophils Relative %: 57.4 %
Platelets: 186 10*3/uL (ref 140–400)
RBC: 3.96 10*6/uL — ABNORMAL LOW (ref 4.20–5.80)
RDW: 13.2 % (ref 11.0–15.0)
Total Lymphocyte: 28.9 %
WBC: 6.4 10*3/uL (ref 3.8–10.8)
WBCMIX: 672 {cells}/uL (ref 200–950)

## 2018-04-20 LAB — FERRITIN: FERRITIN: 24 ng/mL (ref 24–380)

## 2018-04-23 ENCOUNTER — Other Ambulatory Visit: Payer: Self-pay

## 2018-04-23 DIAGNOSIS — I10 Essential (primary) hypertension: Secondary | ICD-10-CM | POA: Diagnosis not present

## 2018-04-23 DIAGNOSIS — D649 Anemia, unspecified: Secondary | ICD-10-CM

## 2018-04-23 DIAGNOSIS — D582 Other hemoglobinopathies: Secondary | ICD-10-CM

## 2018-04-23 DIAGNOSIS — M159 Polyosteoarthritis, unspecified: Secondary | ICD-10-CM | POA: Diagnosis not present

## 2018-04-23 NOTE — Progress Notes (Signed)
Pt is aware. Lab order on file for 08/23/2018.

## 2018-04-24 ENCOUNTER — Other Ambulatory Visit: Payer: Self-pay

## 2018-04-24 DIAGNOSIS — Z6827 Body mass index (BMI) 27.0-27.9, adult: Secondary | ICD-10-CM | POA: Diagnosis not present

## 2018-04-24 DIAGNOSIS — N184 Chronic kidney disease, stage 4 (severe): Secondary | ICD-10-CM | POA: Diagnosis not present

## 2018-04-24 DIAGNOSIS — D649 Anemia, unspecified: Secondary | ICD-10-CM

## 2018-04-24 DIAGNOSIS — Z299 Encounter for prophylactic measures, unspecified: Secondary | ICD-10-CM | POA: Diagnosis not present

## 2018-04-24 DIAGNOSIS — D582 Other hemoglobinopathies: Secondary | ICD-10-CM

## 2018-04-24 DIAGNOSIS — I10 Essential (primary) hypertension: Secondary | ICD-10-CM | POA: Diagnosis not present

## 2018-04-24 DIAGNOSIS — N400 Enlarged prostate without lower urinary tract symptoms: Secondary | ICD-10-CM | POA: Diagnosis not present

## 2018-04-24 NOTE — Progress Notes (Signed)
CC'D TO PCP °

## 2018-04-30 DIAGNOSIS — Z23 Encounter for immunization: Secondary | ICD-10-CM | POA: Diagnosis not present

## 2018-06-15 DIAGNOSIS — M159 Polyosteoarthritis, unspecified: Secondary | ICD-10-CM | POA: Diagnosis not present

## 2018-06-15 DIAGNOSIS — I10 Essential (primary) hypertension: Secondary | ICD-10-CM | POA: Diagnosis not present

## 2018-07-09 DIAGNOSIS — H6691 Otitis media, unspecified, right ear: Secondary | ICD-10-CM | POA: Diagnosis not present

## 2018-07-09 DIAGNOSIS — L600 Ingrowing nail: Secondary | ICD-10-CM | POA: Diagnosis not present

## 2018-07-09 DIAGNOSIS — M79671 Pain in right foot: Secondary | ICD-10-CM | POA: Diagnosis not present

## 2018-07-09 DIAGNOSIS — L03031 Cellulitis of right toe: Secondary | ICD-10-CM | POA: Diagnosis not present

## 2018-07-09 DIAGNOSIS — Z6827 Body mass index (BMI) 27.0-27.9, adult: Secondary | ICD-10-CM | POA: Diagnosis not present

## 2018-07-09 DIAGNOSIS — M79672 Pain in left foot: Secondary | ICD-10-CM | POA: Diagnosis not present

## 2018-07-09 DIAGNOSIS — H6123 Impacted cerumen, bilateral: Secondary | ICD-10-CM | POA: Diagnosis not present

## 2018-07-09 DIAGNOSIS — L03032 Cellulitis of left toe: Secondary | ICD-10-CM | POA: Diagnosis not present

## 2018-07-09 DIAGNOSIS — Z299 Encounter for prophylactic measures, unspecified: Secondary | ICD-10-CM | POA: Diagnosis not present

## 2018-07-09 DIAGNOSIS — I739 Peripheral vascular disease, unspecified: Secondary | ICD-10-CM | POA: Diagnosis not present

## 2018-07-09 DIAGNOSIS — I10 Essential (primary) hypertension: Secondary | ICD-10-CM | POA: Diagnosis not present

## 2018-07-27 DIAGNOSIS — M159 Polyosteoarthritis, unspecified: Secondary | ICD-10-CM | POA: Diagnosis not present

## 2018-07-27 DIAGNOSIS — I10 Essential (primary) hypertension: Secondary | ICD-10-CM | POA: Diagnosis not present

## 2018-08-03 DIAGNOSIS — H6123 Impacted cerumen, bilateral: Secondary | ICD-10-CM | POA: Diagnosis not present

## 2018-08-03 DIAGNOSIS — H6691 Otitis media, unspecified, right ear: Secondary | ICD-10-CM | POA: Diagnosis not present

## 2018-08-03 DIAGNOSIS — N184 Chronic kidney disease, stage 4 (severe): Secondary | ICD-10-CM | POA: Diagnosis not present

## 2018-08-03 DIAGNOSIS — I10 Essential (primary) hypertension: Secondary | ICD-10-CM | POA: Diagnosis not present

## 2018-08-03 DIAGNOSIS — Z6827 Body mass index (BMI) 27.0-27.9, adult: Secondary | ICD-10-CM | POA: Diagnosis not present

## 2018-08-03 DIAGNOSIS — Z789 Other specified health status: Secondary | ICD-10-CM | POA: Diagnosis not present

## 2018-08-03 DIAGNOSIS — Z299 Encounter for prophylactic measures, unspecified: Secondary | ICD-10-CM | POA: Diagnosis not present

## 2018-08-20 DIAGNOSIS — R001 Bradycardia, unspecified: Secondary | ICD-10-CM | POA: Diagnosis not present

## 2018-08-20 DIAGNOSIS — Z45018 Encounter for adjustment and management of other part of cardiac pacemaker: Secondary | ICD-10-CM | POA: Diagnosis not present

## 2018-08-31 DIAGNOSIS — M159 Polyosteoarthritis, unspecified: Secondary | ICD-10-CM | POA: Diagnosis not present

## 2018-08-31 DIAGNOSIS — I10 Essential (primary) hypertension: Secondary | ICD-10-CM | POA: Diagnosis not present

## 2018-09-17 DIAGNOSIS — I10 Essential (primary) hypertension: Secondary | ICD-10-CM | POA: Diagnosis not present

## 2018-09-17 DIAGNOSIS — J069 Acute upper respiratory infection, unspecified: Secondary | ICD-10-CM | POA: Diagnosis not present

## 2018-09-17 DIAGNOSIS — Z6827 Body mass index (BMI) 27.0-27.9, adult: Secondary | ICD-10-CM | POA: Diagnosis not present

## 2018-09-17 DIAGNOSIS — Z87891 Personal history of nicotine dependence: Secondary | ICD-10-CM | POA: Diagnosis not present

## 2018-09-17 DIAGNOSIS — Z299 Encounter for prophylactic measures, unspecified: Secondary | ICD-10-CM | POA: Diagnosis not present

## 2018-09-24 DIAGNOSIS — L03032 Cellulitis of left toe: Secondary | ICD-10-CM | POA: Diagnosis not present

## 2018-09-24 DIAGNOSIS — I739 Peripheral vascular disease, unspecified: Secondary | ICD-10-CM | POA: Diagnosis not present

## 2018-09-24 DIAGNOSIS — M79672 Pain in left foot: Secondary | ICD-10-CM | POA: Diagnosis not present

## 2018-09-24 DIAGNOSIS — M79671 Pain in right foot: Secondary | ICD-10-CM | POA: Diagnosis not present

## 2018-09-24 DIAGNOSIS — L600 Ingrowing nail: Secondary | ICD-10-CM | POA: Diagnosis not present

## 2018-09-24 DIAGNOSIS — L03031 Cellulitis of right toe: Secondary | ICD-10-CM | POA: Diagnosis not present

## 2018-10-23 DIAGNOSIS — I10 Essential (primary) hypertension: Secondary | ICD-10-CM | POA: Diagnosis not present

## 2018-10-23 DIAGNOSIS — M159 Polyosteoarthritis, unspecified: Secondary | ICD-10-CM | POA: Diagnosis not present

## 2018-10-29 DIAGNOSIS — I10 Essential (primary) hypertension: Secondary | ICD-10-CM | POA: Diagnosis not present

## 2018-10-29 DIAGNOSIS — M159 Polyosteoarthritis, unspecified: Secondary | ICD-10-CM | POA: Diagnosis not present

## 2018-11-05 DIAGNOSIS — I10 Essential (primary) hypertension: Secondary | ICD-10-CM | POA: Diagnosis not present

## 2018-11-05 DIAGNOSIS — N184 Chronic kidney disease, stage 4 (severe): Secondary | ICD-10-CM | POA: Diagnosis not present

## 2018-11-05 DIAGNOSIS — Z6827 Body mass index (BMI) 27.0-27.9, adult: Secondary | ICD-10-CM | POA: Diagnosis not present

## 2018-11-05 DIAGNOSIS — Z299 Encounter for prophylactic measures, unspecified: Secondary | ICD-10-CM | POA: Diagnosis not present

## 2018-11-05 DIAGNOSIS — M109 Gout, unspecified: Secondary | ICD-10-CM | POA: Diagnosis not present

## 2018-12-03 DIAGNOSIS — Z6826 Body mass index (BMI) 26.0-26.9, adult: Secondary | ICD-10-CM | POA: Diagnosis not present

## 2018-12-03 DIAGNOSIS — I10 Essential (primary) hypertension: Secondary | ICD-10-CM | POA: Diagnosis not present

## 2018-12-03 DIAGNOSIS — N184 Chronic kidney disease, stage 4 (severe): Secondary | ICD-10-CM | POA: Diagnosis not present

## 2018-12-03 DIAGNOSIS — Z299 Encounter for prophylactic measures, unspecified: Secondary | ICD-10-CM | POA: Diagnosis not present

## 2018-12-03 DIAGNOSIS — Z713 Dietary counseling and surveillance: Secondary | ICD-10-CM | POA: Diagnosis not present

## 2018-12-13 DIAGNOSIS — I10 Essential (primary) hypertension: Secondary | ICD-10-CM | POA: Diagnosis not present

## 2018-12-13 DIAGNOSIS — N184 Chronic kidney disease, stage 4 (severe): Secondary | ICD-10-CM | POA: Diagnosis not present

## 2018-12-13 DIAGNOSIS — R21 Rash and other nonspecific skin eruption: Secondary | ICD-10-CM | POA: Diagnosis not present

## 2018-12-13 DIAGNOSIS — Z299 Encounter for prophylactic measures, unspecified: Secondary | ICD-10-CM | POA: Diagnosis not present

## 2018-12-13 DIAGNOSIS — Z6826 Body mass index (BMI) 26.0-26.9, adult: Secondary | ICD-10-CM | POA: Diagnosis not present

## 2018-12-19 DIAGNOSIS — I10 Essential (primary) hypertension: Secondary | ICD-10-CM | POA: Diagnosis not present

## 2018-12-19 DIAGNOSIS — M159 Polyosteoarthritis, unspecified: Secondary | ICD-10-CM | POA: Diagnosis not present

## 2018-12-21 DIAGNOSIS — Z713 Dietary counseling and surveillance: Secondary | ICD-10-CM | POA: Diagnosis not present

## 2018-12-21 DIAGNOSIS — R21 Rash and other nonspecific skin eruption: Secondary | ICD-10-CM | POA: Diagnosis not present

## 2018-12-21 DIAGNOSIS — Z6826 Body mass index (BMI) 26.0-26.9, adult: Secondary | ICD-10-CM | POA: Diagnosis not present

## 2018-12-21 DIAGNOSIS — Z299 Encounter for prophylactic measures, unspecified: Secondary | ICD-10-CM | POA: Diagnosis not present

## 2018-12-21 DIAGNOSIS — I10 Essential (primary) hypertension: Secondary | ICD-10-CM | POA: Diagnosis not present

## 2019-01-14 DIAGNOSIS — I10 Essential (primary) hypertension: Secondary | ICD-10-CM | POA: Diagnosis not present

## 2019-01-14 DIAGNOSIS — M159 Polyosteoarthritis, unspecified: Secondary | ICD-10-CM | POA: Diagnosis not present

## 2019-02-11 DIAGNOSIS — I10 Essential (primary) hypertension: Secondary | ICD-10-CM | POA: Diagnosis not present

## 2019-02-11 DIAGNOSIS — M159 Polyosteoarthritis, unspecified: Secondary | ICD-10-CM | POA: Diagnosis not present

## 2019-02-25 DIAGNOSIS — Z1331 Encounter for screening for depression: Secondary | ICD-10-CM | POA: Diagnosis not present

## 2019-02-25 DIAGNOSIS — R5383 Other fatigue: Secondary | ICD-10-CM | POA: Diagnosis not present

## 2019-02-25 DIAGNOSIS — Z0000 Encounter for general adult medical examination without abnormal findings: Secondary | ICD-10-CM | POA: Diagnosis not present

## 2019-02-25 DIAGNOSIS — Z1339 Encounter for screening examination for other mental health and behavioral disorders: Secondary | ICD-10-CM | POA: Diagnosis not present

## 2019-02-25 DIAGNOSIS — N400 Enlarged prostate without lower urinary tract symptoms: Secondary | ICD-10-CM | POA: Diagnosis not present

## 2019-02-25 DIAGNOSIS — Z79899 Other long term (current) drug therapy: Secondary | ICD-10-CM | POA: Diagnosis not present

## 2019-02-25 DIAGNOSIS — Z299 Encounter for prophylactic measures, unspecified: Secondary | ICD-10-CM | POA: Diagnosis not present

## 2019-02-25 DIAGNOSIS — Z1211 Encounter for screening for malignant neoplasm of colon: Secondary | ICD-10-CM | POA: Diagnosis not present

## 2019-02-25 DIAGNOSIS — I10 Essential (primary) hypertension: Secondary | ICD-10-CM | POA: Diagnosis not present

## 2019-02-25 DIAGNOSIS — Z125 Encounter for screening for malignant neoplasm of prostate: Secondary | ICD-10-CM | POA: Diagnosis not present

## 2019-02-25 DIAGNOSIS — Z6826 Body mass index (BMI) 26.0-26.9, adult: Secondary | ICD-10-CM | POA: Diagnosis not present

## 2019-02-25 DIAGNOSIS — Z7189 Other specified counseling: Secondary | ICD-10-CM | POA: Diagnosis not present

## 2019-02-28 DIAGNOSIS — Z7982 Long term (current) use of aspirin: Secondary | ICD-10-CM | POA: Diagnosis not present

## 2019-02-28 DIAGNOSIS — Z950 Presence of cardiac pacemaker: Secondary | ICD-10-CM | POA: Diagnosis not present

## 2019-02-28 DIAGNOSIS — Z87891 Personal history of nicotine dependence: Secondary | ICD-10-CM | POA: Diagnosis not present

## 2019-02-28 DIAGNOSIS — R001 Bradycardia, unspecified: Secondary | ICD-10-CM | POA: Diagnosis not present

## 2019-03-01 DIAGNOSIS — I10 Essential (primary) hypertension: Secondary | ICD-10-CM | POA: Diagnosis not present

## 2019-03-01 DIAGNOSIS — R972 Elevated prostate specific antigen [PSA]: Secondary | ICD-10-CM | POA: Diagnosis not present

## 2019-03-01 DIAGNOSIS — N184 Chronic kidney disease, stage 4 (severe): Secondary | ICD-10-CM | POA: Diagnosis not present

## 2019-03-01 DIAGNOSIS — Z6827 Body mass index (BMI) 27.0-27.9, adult: Secondary | ICD-10-CM | POA: Diagnosis not present

## 2019-03-01 DIAGNOSIS — Z299 Encounter for prophylactic measures, unspecified: Secondary | ICD-10-CM | POA: Diagnosis not present

## 2019-03-04 DIAGNOSIS — R001 Bradycardia, unspecified: Secondary | ICD-10-CM | POA: Diagnosis not present

## 2019-03-04 DIAGNOSIS — Z45018 Encounter for adjustment and management of other part of cardiac pacemaker: Secondary | ICD-10-CM | POA: Diagnosis not present

## 2019-03-06 DIAGNOSIS — Z299 Encounter for prophylactic measures, unspecified: Secondary | ICD-10-CM | POA: Diagnosis not present

## 2019-03-06 DIAGNOSIS — I10 Essential (primary) hypertension: Secondary | ICD-10-CM | POA: Diagnosis not present

## 2019-03-06 DIAGNOSIS — M109 Gout, unspecified: Secondary | ICD-10-CM | POA: Diagnosis not present

## 2019-03-06 DIAGNOSIS — Z6827 Body mass index (BMI) 27.0-27.9, adult: Secondary | ICD-10-CM | POA: Diagnosis not present

## 2019-03-06 DIAGNOSIS — Z713 Dietary counseling and surveillance: Secondary | ICD-10-CM | POA: Diagnosis not present

## 2019-03-13 ENCOUNTER — Encounter: Payer: Self-pay | Admitting: Gastroenterology

## 2019-03-13 ENCOUNTER — Other Ambulatory Visit: Payer: Self-pay

## 2019-03-13 ENCOUNTER — Ambulatory Visit (INDEPENDENT_AMBULATORY_CARE_PROVIDER_SITE_OTHER): Payer: Medicare Other | Admitting: Gastroenterology

## 2019-03-13 DIAGNOSIS — K5901 Slow transit constipation: Secondary | ICD-10-CM | POA: Diagnosis not present

## 2019-03-13 DIAGNOSIS — D500 Iron deficiency anemia secondary to blood loss (chronic): Secondary | ICD-10-CM

## 2019-03-13 DIAGNOSIS — K5900 Constipation, unspecified: Secondary | ICD-10-CM | POA: Insufficient documentation

## 2019-03-13 MED ORDER — POLYSACCHARIDE IRON COMPLEX 150 MG PO CAPS: 150.0000 mg | ORAL_CAPSULE | Freq: Every day | ORAL | 3 refills | Status: DC

## 2019-03-13 MED ORDER — MAGNESIUM HYDROXIDE 311 MG PO CHEW: CHEWABLE_TABLET | ORAL | 11 refills | Status: AC

## 2019-03-13 NOTE — Patient Instructions (Addendum)
DRINK WATER TO KEEP YOUR URINE LIGHT YELLOW.  FOLLOW A HIGH FIBER DIET. 75% OF YOUR PLATE SHOULD BE FRUITS OR VEGGIES AND THE REST SHOULD BE A MEATS, BEANS, OR LENTILS.   USE MILK OF MAGNESIA PILLS OR LIQUID TWO OR THREE TIMES A DAY TO REDUCE CONSTIPATION.   COMPLETE LABS TODAY. We will check blood count and iron stores.  FOLLOW UP IN THE OFFICE WILL BE SCHEDULED IF NEEDED AFTER LABS ARE RESULTED.  High-Fiber Diet A high-fiber diet changes your normal diet to include more whole grains, legumes, fruits, and vegetables. Changes in the diet involve replacing refined carbohydrates with unrefined foods. The calorie level of the diet is essentially unchanged. The Dietary Reference Intake (recommended amount) for adult males is 38 grams per day. For adult females, it is 25 grams per day. Pregnant and lactating women should consume 28 grams of fiber per day. Fiber is the intact part of a plant that is not broken down during digestion. Functional fiber is fiber that has been isolated from the plant to provide a beneficial effect in the body.  PURPOSE  Increase stool bulk.   Ease and regulate bowel movements.   Lower cholesterol.   REDUCE RISK OF COLON CANCER  INDICATIONS THAT YOU NEED MORE FIBER  Constipation and hemorrhoids.   Uncomplicated diverticulosis (intestine condition) and irritable bowel syndrome.   Weight management.   As a protective measure against hardening of the arteries (atherosclerosis), diabetes, and cancer.   GUIDELINES FOR INCREASING FIBER IN THE DIET  Start adding fiber to the diet slowly. A gradual increase of about 5 more grams ( 2 servings of most fruits or vegetables) per day is best. Too rapid an increase in fiber may result in constipation, flatulence, and bloating.   Drink enough water and fluids to keep your urine clear or pale yellow. Water, juice, or caffeine-free drinks are recommended. Not drinking enough fluid may cause constipation.   Eat a variety  of high-fiber foods rather than one type of fiber.   Try to increase your intake of fiber through using high-fiber foods rather than fiber pills or supplements that contain small amounts of fiber.   The goal is to change the types of food eaten. Do not supplement your present diet with high-fiber foods, but replace foods in your present diet.    INCLUDE A VARIETY OF FIBER SOURCES  Brown whole-grain rice, buckwheat oats, and many fruits and vegetables are all good sources of fiber. These include: broccoli, Brussels sprouts, cabbage, cauliflower, beets, sweet potatoes, white potatoes (skin on), carrots, tomatoes, eggplant, squash, berries, fresh fruits, and dried fruits.   You may need to include additional fruits and vegetables each day.

## 2019-03-13 NOTE — Assessment & Plan Note (Signed)
SYMPTOMS NOT IDEALLY CONTROLLED.  DRINK WATER TO KEEP YOUR URINE LIGHT YELLOW. FOLLOW A HIGH FIBER DIET. 75% OF YOUR PLATE SHOULD BE FRUITS OR VEGGIES AND THE REST SHOULD BE A MEATS, BEANS, OR LENTILS.  USE MILK OF MAGNESIA PILLS OR LIQUID TWO OR THREE TIMES A DAY TO REDUCE CONSTIPATION. FOLLOW UP IN THE OFFICE WILL BE SCHEDULED IF NEEDED AFTER LABS ARE RESULTED.

## 2019-03-13 NOTE — Progress Notes (Signed)
CC'ED TO PCP 

## 2019-03-13 NOTE — Assessment & Plan Note (Signed)
LAST Hb Mar 01 2019 12.6. NO BRBPR OR MELENA.  COMPLETE LABS TODAY: CBC, FERRITIN. FOLLOW UP IN THE OFFICE WILL BE SCHEDULED IF NEEDED AFTER LABS ARE RESULTED.

## 2019-03-13 NOTE — Progress Notes (Signed)
Subjective:    Patient ID: Austin Jacobs, male    DOB: 1917/02/13, 85 y.o.   MRN: [id number]  Monico Blitz, MD  HPI NOT A REAL GOOD MOVEMENT AND IT'S REAL SMALL. STILL TAKING IRON. BLACK STOOL: BSC #4 AND MAY HAVE 1-3 TIMES A DAY. BEEN FEELING TIRED AND WASHED OUT FOR 4-5 DAYS. HAD GOUT(R FOOT) AND STARTED.@ AUG 12 AND FINISHED YESTERDAY. LEGS AND ARMS DON'T FEEL HEAVY. JUST DOESN'T HAVE ANY ENERGY. MAY HAVE INDIGESTION IN PAST 4-5 DAYS. TOOK TUMS SOME. SAW DR. [place] AUG 7-Hb 12.6.  PT DENIES FEVER, CHILLS, HEMATOCHEZIA, HEMATEMESIS, nausea, vomiting, melena, diarrhea, CHEST PAIN, SHORTNESS OF BREATH, CHANGE IN BOWEL IN HABITS, constipation, abdominal pain, OR problems swallowing.   Past Medical History:  Diagnosis Date  . BPH (benign prostatic hyperplasia)   . Chronic kidney disease   . Hypertension   . Presence of permanent cardiac pacemaker    Past Surgical History:  Procedure Laterality Date  . APPENDECTOMY    . COLONOSCOPY    . ESOPHAGOGASTRODUODENOSCOPY N/A 10/24/2017   Procedure: ESOPHAGOGASTRODUODENOSCOPY (EGD);  Surgeon: Danie Binder, MD;  Location: [facility];  Service: Endoscopy;  Laterality: N/A;  2:45pm  . HERNIA REPAIR    . PACEMAKER IMPLANT     No Known Allergies  Current Outpatient Medications  Medication Sig    . aliskiren (TEKTURNA) 150 MG tablet Take 150 mg by mouth daily.    Marland Kitchen amoxicillin (AMOXIL) 500 MG capsule Take 2,000 mg by mouth as directed. One hour prior to dental appointments    . finasteride (PROSCAR) 5 MG tablet Take 1 tablet by mouth daily.    . iron polysaccharides (NIFEREX) 150 MG capsule Take 1 capsule (150 mg total) by mouth daily.    . Multiple Vitamins-Minerals (CENTRUM SILVER PO) Take 1 tablet by mouth daily.    . polyethylene glycol powder (GLYCOLAX/MIRALAX) powder Take 17 g by mouth daily as needed.    . Polyvinyl Alcohol-Povidone (REFRESH OP) Apply to eye at bedtime.    . tamsulosin (FLOMAX) 0.4 MG CAPS capsule Take 0.8 mg by  mouth daily.     Marland Kitchen erythromycin ophthalmic ointment 1 application See admin instructions. Applied inside the bottom lower eyelids as directed daily    .       Review of Systems PER HPI OTHERWISE ALL SYSTEMS ARE NEGATIVE.    Objective:   Physical Exam Vitals signs reviewed.  Constitutional:      General: He is not in acute distress.    Appearance: He is well-developed.  HENT:     Head: Normocephalic and atraumatic.     Mouth/Throat:     Comments: MASK IN PLACE Eyes:     General: No scleral icterus. Neck:     Musculoskeletal: Normal range of motion and neck supple.  Cardiovascular:     Rate and Rhythm: Normal rate and regular rhythm.     Heart sounds: Normal heart sounds.  Pulmonary:     Effort: Pulmonary effort is normal. No respiratory distress.     Breath sounds: Normal breath sounds.  Abdominal:     General: Bowel sounds are normal. There is no distension.     Palpations: Abdomen is soft.     Tenderness: There is no abdominal tenderness.     Comments: EXAM LIMITED-PT IN CHAIR  Musculoskeletal:     Right lower leg: No edema.     Left lower leg: No edema.     Comments: WALKS ASSISTED WITH A CANE.  Lymphadenopathy:     Cervical: No cervical adenopathy.  Skin:    General: Skin is warm.  Neurological:     Mental Status: He is alert and oriented to person, place, and time.     Comments: NO  NEW FOCAL DEFICITS  Psychiatric:        Mood and Affect: Mood normal.        Thought Content: Thought content normal.         Assessment & Plan:

## 2019-03-14 ENCOUNTER — Telehealth: Payer: Self-pay | Admitting: Gastroenterology

## 2019-03-14 LAB — CBC WITH DIFFERENTIAL/PLATELET
Absolute Monocytes: 1012 cells/uL — ABNORMAL HIGH (ref 200–950)
Basophils Absolute: 44 cells/uL (ref 0–200)
Basophils Relative: 0.4 %
Eosinophils Absolute: 121 cells/uL (ref 15–500)
Eosinophils Relative: 1.1 %
HCT: 38.4 % — ABNORMAL LOW (ref 38.5–50.0)
Hemoglobin: 12.8 g/dL — ABNORMAL LOW (ref 13.2–17.1)
Lymphs Abs: 2519 cells/uL (ref 850–3900)
MCH: 32.6 pg (ref 27.0–33.0)
MCHC: 33.3 g/dL (ref 32.0–36.0)
MCV: 97.7 fL (ref 80.0–100.0)
MPV: 9.6 fL (ref 7.5–12.5)
Monocytes Relative: 9.2 %
Neutro Abs: 7304 cells/uL (ref 1500–7800)
Neutrophils Relative %: 66.4 %
Platelets: 256 10*3/uL (ref 140–400)
RBC: 3.93 10*6/uL — ABNORMAL LOW (ref 4.20–5.80)
RDW: 12.8 % (ref 11.0–15.0)
Total Lymphocyte: 22.9 %
WBC: 11 10*3/uL — ABNORMAL HIGH (ref 3.8–10.8)

## 2019-03-14 LAB — FERRITIN: Ferritin: 104 ng/mL (ref 24–380)

## 2019-03-14 NOTE — Telephone Encounter (Signed)
Pt is aware of results and plan.

## 2019-03-14 NOTE — Telephone Encounter (Signed)
PLEASE CALL PT. HIS BLOOD COUNT(HEMOGLOBIN) IS 12.8, WHICH IS NORMAL. HIS IRON STORES ARE NORMAL AS WELL. HIS FERRITIN IS 104, WHICH IS MUCH IMPROVED SINCE SEP 2019. HE SHOULD CONTINUE HIS IRON PILLS.

## 2019-03-14 NOTE — Telephone Encounter (Signed)
Called, many rings and no answer.

## 2019-03-19 DIAGNOSIS — R350 Frequency of micturition: Secondary | ICD-10-CM | POA: Diagnosis not present

## 2019-03-19 DIAGNOSIS — Z7982 Long term (current) use of aspirin: Secondary | ICD-10-CM | POA: Diagnosis not present

## 2019-03-19 DIAGNOSIS — R300 Dysuria: Secondary | ICD-10-CM | POA: Diagnosis not present

## 2019-03-19 DIAGNOSIS — Z79899 Other long term (current) drug therapy: Secondary | ICD-10-CM | POA: Diagnosis not present

## 2019-03-19 DIAGNOSIS — K5900 Constipation, unspecified: Secondary | ICD-10-CM | POA: Diagnosis not present

## 2019-03-20 DIAGNOSIS — I10 Essential (primary) hypertension: Secondary | ICD-10-CM | POA: Diagnosis not present

## 2019-03-20 DIAGNOSIS — M159 Polyosteoarthritis, unspecified: Secondary | ICD-10-CM | POA: Diagnosis not present

## 2019-03-21 ENCOUNTER — Encounter (HOSPITAL_COMMUNITY): Payer: Self-pay | Admitting: Emergency Medicine

## 2019-03-21 ENCOUNTER — Other Ambulatory Visit: Payer: Self-pay

## 2019-03-21 ENCOUNTER — Emergency Department (HOSPITAL_COMMUNITY)
Admission: EM | Admit: 2019-03-21 | Discharge: 2019-03-21 | Disposition: A | Discharge status: Home / Self Care | Payer: Medicare Other | Attending: Emergency Medicine | Admitting: Emergency Medicine

## 2019-03-21 DIAGNOSIS — R339 Retention of urine, unspecified: Secondary | ICD-10-CM | POA: Diagnosis not present

## 2019-03-21 DIAGNOSIS — N342 Other urethritis: Secondary | ICD-10-CM

## 2019-03-21 DIAGNOSIS — N4889 Other specified disorders of penis: Secondary | ICD-10-CM | POA: Diagnosis present

## 2019-03-21 DIAGNOSIS — N341 Nonspecific urethritis: Secondary | ICD-10-CM | POA: Diagnosis not present

## 2019-03-21 DIAGNOSIS — N189 Chronic kidney disease, unspecified: Secondary | ICD-10-CM | POA: Diagnosis not present

## 2019-03-21 DIAGNOSIS — Z79899 Other long term (current) drug therapy: Secondary | ICD-10-CM | POA: Insufficient documentation

## 2019-03-21 DIAGNOSIS — Z950 Presence of cardiac pacemaker: Secondary | ICD-10-CM | POA: Insufficient documentation

## 2019-03-21 DIAGNOSIS — I129 Hypertensive chronic kidney disease with stage 1 through stage 4 chronic kidney disease, or unspecified chronic kidney disease: Secondary | ICD-10-CM | POA: Insufficient documentation

## 2019-03-21 DIAGNOSIS — Z87891 Personal history of nicotine dependence: Secondary | ICD-10-CM | POA: Insufficient documentation

## 2019-03-21 LAB — BASIC METABOLIC PANEL
Anion gap: 11 (ref 5–15)
BUN: 46 mg/dL — ABNORMAL HIGH (ref 8–23)
CO2: 24 mmol/L (ref 22–32)
Calcium: 9.5 mg/dL (ref 8.9–10.3)
Chloride: 102 mmol/L (ref 98–111)
Creatinine, Ser: 2.42 mg/dL — ABNORMAL HIGH (ref 0.61–1.24)
GFR calc Af Amer: 24 mL/min — ABNORMAL LOW (ref >60)
GFR calc non Af Amer: 21 mL/min — ABNORMAL LOW (ref >60)
Glucose, Bld: 117 mg/dL — ABNORMAL HIGH (ref 70–99)
Potassium: 4.8 mmol/L (ref 3.5–5.1)
Sodium: 137 mmol/L (ref 135–145)

## 2019-03-21 LAB — URINALYSIS, ROUTINE W REFLEX MICROSCOPIC
Bilirubin Urine: NEGATIVE
Glucose, UA: NEGATIVE mg/dL
Ketones, ur: NEGATIVE mg/dL
Leukocytes,Ua: NEGATIVE
Nitrite: NEGATIVE
Protein, ur: NEGATIVE mg/dL
Specific Gravity, Urine: 1.012 (ref 1.005–1.030)
pH: 6 (ref 5.0–8.0)

## 2019-03-21 MED ORDER — LIDOCAINE HCL URETHRAL/MUCOSAL 2 % EX GEL
1.0000 "application " | Freq: Once | CUTANEOUS | Status: AC
  Administered 2019-03-21: 1 via URETHRAL

## 2019-03-21 NOTE — Discharge Instructions (Signed)
Please keep the catheter in place, empty the bag frequently, seek medical exam for increasing or severe or worsening symptoms.  It will feel abnormal to have a catheter in your penis, it may feel like burning, pain or may feel like you want to urinate frequently however the urine will naturally come out and you do not have to use the bathroom to do that.  Seek medical exam for increasing pain, fever or vomiting.

## 2019-03-21 NOTE — ED Triage Notes (Signed)
Patient with frequent urination and pain in his penis, onset 4 days ago. Patient seen at [facility] Tuesday. Sent to ED by Dr. Brigitte Pulse.

## 2019-03-21 NOTE — ED Provider Notes (Signed)
[facility] EMERGENCY DEPARTMENT Provider Note   CSN: [id number] Arrival date & time: 03/21/19  1445     History   Chief Complaint Chief Complaint  Patient presents with  . Penis Pain    HPI Austin Jacobs is a 85 y.o. male.     HPI  The patient is a 85 year old male, Austin Jacobs has a known history of chronic kidney disease, Austin Jacobs has benign prostatic hyperplasia, hypertension and a history of constipation.  The patient is followed by multiple physicians including a gastroenterologist who prescribed some increasing constipation medications last week.  Unfortunately the patient is struggled with dysuria and feels like Austin Jacobs has a significant burning and discomfort in the end of his penis.  Austin Jacobs presents to the hospital today after last being seen 24 hours ago at an outside hospital where Austin Jacobs was evaluated for the same condition.  The patient brings the paperwork with him which showed that Austin Jacobs had a totally normal urinalysis.  Austin Jacobs reportedly had a normal CT scan without any findings of obstructive uropathy.  They also did a bladder scan and did not find any significant retained urine however the patient states that when Austin Jacobs tries to urinate Austin Jacobs only dribbles a little bit, very little comes out.  Austin Jacobs feels like Austin Jacobs has to go constantly.  There is no fevers chills nausea or vomiting.  Austin Jacobs has been scheduled to follow-up with a urologist but has not yet been seen  Past Medical History:  Diagnosis Date  . BPH (benign prostatic hyperplasia)   . Chronic kidney disease   . Hypertension   . Presence of permanent cardiac pacemaker     Patient Active Problem List   Diagnosis Date Noted  . Constipation 03/13/2019  . Normocytic anemia due to blood loss 10/12/2017  . Chest pain 05/09/2013  . HTN (hypertension) 05/09/2013  . Mobitz (type) I [facility]) atrioventricular block 05/09/2013    Past Surgical History:  Procedure Laterality Date  . APPENDECTOMY    . COLONOSCOPY    . ESOPHAGOGASTRODUODENOSCOPY N/A  10/24/2017   Procedure: ESOPHAGOGASTRODUODENOSCOPY (EGD);  Surgeon: Danie Binder, MD;  Location: [facility];  Service: Endoscopy;  Laterality: N/A;  2:45pm  . HERNIA REPAIR    . PACEMAKER IMPLANT          Home Medications    Prior to Admission medications   Medication Sig Start Date End Date Taking? Authorizing Provider  aliskiren (TEKTURNA) 150 MG tablet Take 150 mg by mouth daily.    [provider]  amoxicillin (AMOXIL) 500 MG capsule Take 2,000 mg by mouth as directed. One hour prior to dental appointments 01/19/15   [provider]  erythromycin ophthalmic ointment 1 application See admin instructions. Applied inside the bottom lower eyelids as directed daily 12/01/17   [provider]  finasteride (PROSCAR) 5 MG tablet Take 1 tablet by mouth daily. 01/24/15   [provider]  iron polysaccharides (NIFEREX) 150 MG capsule Take 1 capsule (150 mg total) by mouth daily. 03/13/19   Fields, Marga Melnick, MD  magnesium hydroxide (PHILLIPS MILK OF MAGNESIA) 311 MG CHEW chewable tablet 1 PO ONE TO THREE TIMES A DAY. 03/13/19   Fields, Marga Melnick, MD  Multiple Vitamins-Minerals (CENTRUM SILVER PO) Take 1 tablet by mouth daily.    [provider]  pantoprazole (PROTONIX) 40 MG tablet Take 1 tablet (40 mg total) by mouth daily. Patient not taking: Reported on 03/13/2019 04/19/18   Danie Binder, MD  polyethylene glycol powder [facility])  powder Take 17 g by mouth daily as needed. 04/04/13   [provider]  Polyvinyl Alcohol-Povidone ([place] OP) Apply to eye at bedtime.    [provider]  tamsulosin (FLOMAX) 0.4 MG CAPS capsule Take 0.8 mg by mouth daily.     [provider]    Family History Family History  Problem Relation Age of Onset  . Stroke Mother   . Lung cancer Father   . Colon cancer Neg Hx   . Colon polyps Neg Hx     Social History Social History   Tobacco Use  . Smoking status: Former Smoker     Packs/day: 0.50    Years: 22.00    Pack years: 11.00    Types: Cigarettes, Pipe    Start date: 07/26/1935    Quit date: 07/25/1957    Years since quitting: 61.6  . Smokeless tobacco: Never Used  Substance Use Topics  . Alcohol use: Yes    Alcohol/week: 0.0 standard drinks    Comment: Occasionally- few times a month  . Drug use: No     Allergies   Patient has no known allergies.   Review of Systems Review of Systems  All other systems reviewed and are negative.    Physical Exam Updated Vital Signs BP (!) 147/66   Pulse 63   Temp 97.8 F (36.6 C) (Oral)   Resp 16   SpO2 100%   Physical Exam Vitals signs and nursing note reviewed.  Constitutional:      General: Austin Jacobs is not in acute distress.    Appearance: Austin Jacobs is well-developed.  HENT:     Head: Normocephalic and atraumatic.     Mouth/Throat:     Pharynx: No oropharyngeal exudate.  Eyes:     General: No scleral icterus.       Right eye: No discharge.        Left eye: No discharge.     Conjunctiva/sclera: Conjunctivae normal.     Pupils: Pupils are equal, round, and reactive to light.  Neck:     Musculoskeletal: Normal range of motion and neck supple.     Thyroid: No thyromegaly.     Vascular: No JVD.  Cardiovascular:     Rate and Rhythm: Normal rate and regular rhythm.     Heart sounds: Normal heart sounds. No murmur. No friction rub. No gallop.   Pulmonary:     Effort: Pulmonary effort is normal. No respiratory distress.     Breath sounds: Normal breath sounds. No wheezing or rales.  Abdominal:     General: Bowel sounds are normal. There is no distension.     Palpations: Abdomen is soft. There is no mass.     Tenderness: There is abdominal tenderness ( SP ttp and fullness).  Genitourinary:    Comments: Causes of urethritis this patient's penis is normal-appearing, the foreskin retracts very easily, the corona appears normal, the urethral meatus appears normal, there is no discharge, no redness, no drainage, no  foul smell, the shaft is nontender and normal-appearing.  The scrotum and bilateral testicles are totally normal without tenderness masses or abnormalities and there is no inguinal lymphadenopathy Musculoskeletal: Normal range of motion.        General: No tenderness.  Lymphadenopathy:     Cervical: No cervical adenopathy.  Skin:    General: Skin is warm and dry.     Findings: No erythema or rash.  Neurological:     Mental Status: Austin Jacobs is alert.  Coordination: Coordination normal.  Psychiatric:        Behavior: Behavior normal.      ED Treatments / Results  Labs (all labs ordered are listed, but only abnormal results are displayed) Labs Reviewed  URINALYSIS, ROUTINE W REFLEX MICROSCOPIC - Abnormal; Notable for the following components:      Result Value   Hgb urine dipstick SMALL (*)    Bacteria, UA RARE (*)    All other components within normal limits  BASIC METABOLIC PANEL - Abnormal; Notable for the following components:   Glucose, Bld 117 (*)    BUN 46 (*)    Creatinine, Ser 2.42 (*)    GFR calc non Af Amer 21 (*)    GFR calc Af Amer 24 (*)    All other components within normal limits  URINE CULTURE    EKG None  Radiology No results found.  Procedures Procedures (including critical care time)  Medications Ordered in ED Medications  lidocaine (XYLOCAINE) 2 % jelly 1 application (1 application Urethral Given 03/21/19 1940)     Initial Impression / Assessment and Plan / ED Course  I have reviewed the triage vital signs and the nursing notes.  Pertinent labs & imaging results that were available during my care of the patient were reviewed by me and considered in my medical decision making (see chart for details).  Clinical Course as of Mar 20 2105  Thu Mar 21, 2019  2031 Renal function similar to prior 1 year ago - no sig changes in Cr.   [BM]    Clinical Course User Index [BM] Noemi Chapel, MD       The cause of the patient's urethritis is not  clear.  At this time I will do a bedside ultrasound to further evaluate his bladder and kidneys, we will try to check a urinalysis though I suspect will be negative.  The cause of his urethritis is not is clear.  This appears to be a nongonococcal urethritis  As it does not appear to be sexually transmitted I would also consider that this could potentially be related to inflammatory conditions, possible kidney stone, it does not appear to be a purulent or sexually related urethritis.  Bedside US shows large distended bladder - no hydronephrosis - check BMP, place foley and anticipate d/c  After placement of a Foley catheter the patient feels well, his renal function is at baseline, Austin Jacobs has no more abdominal discomfort and the burning in his penis is significantly lessened.  Austin Jacobs has had significant amount of urinary output, I have discussed with the patient and his family member the care of a Foley catheter and they are in agreement, stable for discharge  No signs of UTI  Final Clinical Impressions(s) / ED Diagnoses   Final diagnoses:  Urinary retention  Urethritis    ED Discharge Orders    None       Noemi Chapel, MD 03/21/19 2106

## 2019-03-22 DIAGNOSIS — Z6826 Body mass index (BMI) 26.0-26.9, adult: Secondary | ICD-10-CM | POA: Diagnosis not present

## 2019-03-22 DIAGNOSIS — I10 Essential (primary) hypertension: Secondary | ICD-10-CM | POA: Diagnosis not present

## 2019-03-22 DIAGNOSIS — R338 Other retention of urine: Secondary | ICD-10-CM | POA: Diagnosis not present

## 2019-03-22 DIAGNOSIS — Z299 Encounter for prophylactic measures, unspecified: Secondary | ICD-10-CM | POA: Diagnosis not present

## 2019-03-22 DIAGNOSIS — N400 Enlarged prostate without lower urinary tract symptoms: Secondary | ICD-10-CM | POA: Diagnosis not present

## 2019-03-22 DIAGNOSIS — N184 Chronic kidney disease, stage 4 (severe): Secondary | ICD-10-CM | POA: Diagnosis not present

## 2019-03-23 LAB — URINE CULTURE: Culture: NO GROWTH

## 2019-03-27 ENCOUNTER — Encounter: Payer: Self-pay | Admitting: Gastroenterology

## 2019-04-02 ENCOUNTER — Ambulatory Visit (INDEPENDENT_AMBULATORY_CARE_PROVIDER_SITE_OTHER): Payer: Medicare Other | Admitting: Urology

## 2019-04-02 DIAGNOSIS — R972 Elevated prostate specific antigen [PSA]: Secondary | ICD-10-CM

## 2019-04-02 DIAGNOSIS — R3914 Feeling of incomplete bladder emptying: Secondary | ICD-10-CM

## 2019-04-02 DIAGNOSIS — N401 Enlarged prostate with lower urinary tract symptoms: Secondary | ICD-10-CM | POA: Diagnosis not present

## 2019-04-02 DIAGNOSIS — R338 Other retention of urine: Secondary | ICD-10-CM | POA: Diagnosis not present

## 2019-04-04 ENCOUNTER — Other Ambulatory Visit (HOSPITAL_COMMUNITY): Payer: Self-pay | Admitting: Urology

## 2019-04-04 DIAGNOSIS — R972 Elevated prostate specific antigen [PSA]: Secondary | ICD-10-CM

## 2019-04-05 DIAGNOSIS — M47816 Spondylosis without myelopathy or radiculopathy, lumbar region: Secondary | ICD-10-CM | POA: Diagnosis not present

## 2019-04-05 DIAGNOSIS — M9903 Segmental and somatic dysfunction of lumbar region: Secondary | ICD-10-CM | POA: Diagnosis not present

## 2019-04-09 ENCOUNTER — Ambulatory Visit (HOSPITAL_COMMUNITY)
Admission: RE | Admit: 2019-04-09 | Discharge: 2019-04-09 | Disposition: A | Discharge status: Home / Self Care | Payer: Medicare Other | Source: Ambulatory Visit | Attending: Urology | Admitting: Urology

## 2019-04-09 ENCOUNTER — Other Ambulatory Visit: Payer: Self-pay

## 2019-04-09 ENCOUNTER — Other Ambulatory Visit: Payer: Self-pay | Admitting: Urology

## 2019-04-09 DIAGNOSIS — R972 Elevated prostate specific antigen [PSA]: Secondary | ICD-10-CM | POA: Insufficient documentation

## 2019-04-09 MED ORDER — GENTAMICIN SULFATE 40 MG/ML IJ SOLN
160.0000 mg | Freq: Once | INTRAMUSCULAR | Status: AC
  Administered 2019-04-09: 12:00:00 160 mg via INTRAMUSCULAR

## 2019-04-09 MED ORDER — GENTAMICIN SULFATE 40 MG/ML IJ SOLN
INTRAMUSCULAR | Status: AC
  Administered 2019-04-09: 12:00:00 160 mg via INTRAMUSCULAR
  Filled 2019-04-09: qty 4

## 2019-04-09 MED ORDER — LIDOCAINE HCL (PF) 2 % IJ SOLN
INTRAMUSCULAR | Status: AC
  Filled 2019-04-09: qty 10

## 2019-04-17 DIAGNOSIS — I10 Essential (primary) hypertension: Secondary | ICD-10-CM | POA: Diagnosis not present

## 2019-04-17 DIAGNOSIS — M159 Polyosteoarthritis, unspecified: Secondary | ICD-10-CM | POA: Diagnosis not present

## 2019-04-18 ENCOUNTER — Other Ambulatory Visit: Payer: Self-pay | Admitting: Urology

## 2019-04-22 ENCOUNTER — Other Ambulatory Visit (HOSPITAL_COMMUNITY): Payer: Self-pay | Admitting: Urology

## 2019-04-22 ENCOUNTER — Other Ambulatory Visit: Payer: Self-pay | Admitting: Urology

## 2019-04-23 ENCOUNTER — Other Ambulatory Visit: Payer: Self-pay | Admitting: Urology

## 2019-04-23 ENCOUNTER — Other Ambulatory Visit (HOSPITAL_COMMUNITY): Payer: Self-pay | Admitting: Urology

## 2019-04-23 DIAGNOSIS — C61 Malignant neoplasm of prostate: Secondary | ICD-10-CM

## 2019-04-25 ENCOUNTER — Other Ambulatory Visit (HOSPITAL_COMMUNITY): Payer: Self-pay | Admitting: Urology

## 2019-04-25 ENCOUNTER — Ambulatory Visit (HOSPITAL_COMMUNITY)
Admission: RE | Admit: 2019-04-25 | Discharge: 2019-04-25 | Disposition: A | Discharge status: Home / Self Care | Payer: Medicare Other | Source: Ambulatory Visit | Attending: Urology | Admitting: Urology

## 2019-04-25 ENCOUNTER — Encounter (HOSPITAL_COMMUNITY): Payer: Self-pay

## 2019-04-25 ENCOUNTER — Other Ambulatory Visit: Payer: Self-pay

## 2019-04-25 ENCOUNTER — Encounter (HOSPITAL_COMMUNITY)
Admission: RE | Admit: 2019-04-25 | Discharge: 2019-04-25 | Disposition: A | Discharge status: Home / Self Care | Payer: Medicare Other | Source: Ambulatory Visit | Attending: Urology | Admitting: Urology

## 2019-04-25 DIAGNOSIS — Z299 Encounter for prophylactic measures, unspecified: Secondary | ICD-10-CM | POA: Diagnosis not present

## 2019-04-25 DIAGNOSIS — C61 Malignant neoplasm of prostate: Secondary | ICD-10-CM

## 2019-04-25 DIAGNOSIS — N184 Chronic kidney disease, stage 4 (severe): Secondary | ICD-10-CM | POA: Diagnosis not present

## 2019-04-25 DIAGNOSIS — C419 Malignant neoplasm of bone and articular cartilage, unspecified: Secondary | ICD-10-CM | POA: Diagnosis not present

## 2019-04-25 DIAGNOSIS — Z6825 Body mass index (BMI) 25.0-25.9, adult: Secondary | ICD-10-CM | POA: Diagnosis not present

## 2019-04-25 DIAGNOSIS — N400 Enlarged prostate without lower urinary tract symptoms: Secondary | ICD-10-CM | POA: Diagnosis not present

## 2019-04-25 DIAGNOSIS — I10 Essential (primary) hypertension: Secondary | ICD-10-CM | POA: Diagnosis not present

## 2019-04-25 MED ORDER — TECHNETIUM TC 99M MEDRONATE IV KIT
20.0000 | PACK | Freq: Once | INTRAVENOUS | Status: AC | PRN
  Administered 2019-04-25: 08:00:00 19.8 via INTRAVENOUS

## 2019-05-14 ENCOUNTER — Ambulatory Visit (INDEPENDENT_AMBULATORY_CARE_PROVIDER_SITE_OTHER): Payer: Medicare Other | Admitting: Urology

## 2019-05-14 ENCOUNTER — Other Ambulatory Visit: Payer: Self-pay

## 2019-05-14 DIAGNOSIS — Z87891 Personal history of nicotine dependence: Secondary | ICD-10-CM | POA: Diagnosis not present

## 2019-05-14 DIAGNOSIS — N189 Chronic kidney disease, unspecified: Secondary | ICD-10-CM | POA: Diagnosis not present

## 2019-05-14 DIAGNOSIS — Z950 Presence of cardiac pacemaker: Secondary | ICD-10-CM | POA: Insufficient documentation

## 2019-05-14 DIAGNOSIS — T83098A Other mechanical complication of other indwelling urethral catheter, initial encounter: Secondary | ICD-10-CM | POA: Diagnosis not present

## 2019-05-14 DIAGNOSIS — C61 Malignant neoplasm of prostate: Secondary | ICD-10-CM

## 2019-05-14 DIAGNOSIS — R972 Elevated prostate specific antigen [PSA]: Secondary | ICD-10-CM

## 2019-05-14 DIAGNOSIS — T83011A Breakdown (mechanical) of indwelling urethral catheter, initial encounter: Secondary | ICD-10-CM | POA: Insufficient documentation

## 2019-05-14 DIAGNOSIS — I129 Hypertensive chronic kidney disease with stage 1 through stage 4 chronic kidney disease, or unspecified chronic kidney disease: Secondary | ICD-10-CM | POA: Diagnosis not present

## 2019-05-14 DIAGNOSIS — Y732 Prosthetic and other implants, materials and accessory gastroenterology and urology devices associated with adverse incidents: Secondary | ICD-10-CM | POA: Diagnosis not present

## 2019-05-14 NOTE — ED Triage Notes (Signed)
Pt c/o pain and inability to urinate after catheter fell out. Pt states it was changed today and has had to have one chronically since August.

## 2019-05-15 ENCOUNTER — Emergency Department (HOSPITAL_COMMUNITY)
Admission: EM | Admit: 2019-05-15 | Discharge: 2019-05-15 | Disposition: A | Discharge status: Home / Self Care | Payer: Medicare Other | Attending: Emergency Medicine | Admitting: Emergency Medicine

## 2019-05-15 ENCOUNTER — Encounter (HOSPITAL_COMMUNITY): Payer: Self-pay | Admitting: Emergency Medicine

## 2019-05-15 DIAGNOSIS — T839XXA Unspecified complication of genitourinary prosthetic device, implant and graft, initial encounter: Secondary | ICD-10-CM

## 2019-05-15 NOTE — ED Notes (Signed)
Pt left without paperwork, d/c VS, or signing d/c papers

## 2019-05-15 NOTE — Discharge Instructions (Addendum)
Recheck if your catheter does not drain urine or you have any further problems.

## 2019-05-15 NOTE — ED Provider Notes (Signed)
[facility] EMERGENCY DEPARTMENT Provider Note   CSN: [id number] Arrival date & time: 05/14/19  2339   Time seen 12:19 AM  History   Chief Complaint Chief Complaint  Patient presents with  . Catheter Problem    HPI Austin Jacobs is a 85 y.o. male.     HPI patient had a Foley catheter placed on August 27 and is had to have 1 since due to enlarged prostate and inability to urinate.  He was actually seen today by Dr. Diona Fanti and they replaced his catheter.  They report it has not had any drainage and when they checked this evening the tube had come out.  He is complaining of pain in his penis but not in his abdomen.  When I checked the catheter the balloon is not inflated and I do not see any obvious holes.  Wife states she saw the nurse and insert fluid in the balloon.  PCP Monico Blitz, MD    Past Medical History:  Diagnosis Date  . BPH (benign prostatic hyperplasia)   . Chronic kidney disease   . Hypertension   . Presence of permanent cardiac pacemaker     Patient Active Problem List   Diagnosis Date Noted  . Constipation 03/13/2019  . Normocytic anemia due to blood loss 10/12/2017  . Chest pain 05/09/2013  . HTN (hypertension) 05/09/2013  . Mobitz (type) I [facility]) atrioventricular block 05/09/2013    Past Surgical History:  Procedure Laterality Date  . APPENDECTOMY    . COLONOSCOPY    . ESOPHAGOGASTRODUODENOSCOPY N/A 10/24/2017   Procedure: ESOPHAGOGASTRODUODENOSCOPY (EGD);  Surgeon: Danie Binder, MD;  Location: [facility]O [facility];  Service: Endoscopy;  Laterality: N/A;  2:45pm  . HERNIA REPAIR    . PACEMAKER IMPLANT          Home Medications    Prior to Admission medications   Medication Sig Start Date End Date Taking? Authorizing Provider  aliskiren (TEKTURNA) 150 MG tablet Take 150 mg by mouth daily.    [provider]  amoxicillin (AMOXIL) 500 MG capsule Take 2,000 mg by mouth as directed. One hour prior to dental appointments 01/19/15    [provider]  erythromycin ophthalmic ointment 1 application See admin instructions. Applied inside the bottom lower eyelids as directed daily 12/01/17   [provider]  finasteride (PROSCAR) 5 MG tablet Take 1 tablet by mouth daily. 01/24/15   [provider]  iron polysaccharides (NIFEREX) 150 MG capsule Take 1 capsule (150 mg total) by mouth daily. 03/13/19   Fields, Marga Melnick, MD  magnesium hydroxide (PH[place]IPS MILK OF MAGNESIA) 311 MG CHEW chewable tablet 1 PO ONE TO THREE TIMES A DAY. 03/13/19   Fields, Marga Melnick, MD  Multiple Vitamins-Minerals (CENTRUM SILVER PO) Take 1 tablet by mouth daily.    [provider]  pantoprazole (PROTONIX) 40 MG tablet Take 1 tablet (40 mg total) by mouth daily. Patient not taking: Reported on 03/13/2019 04/19/18   Danie Binder, MD  polyethylene glycol powder (GLYCOLAX/MIRALAX) powder Take 17 g by mouth daily as needed. 04/04/13   [provider]  Polyvinyl Alcohol-Povidone ([place] OP) Apply to eye at bedtime.    [provider]  tamsulosin (FLOMAX) 0.4 MG CAPS capsule Take 0.8 mg by mouth daily.     [provider]    Family History Family History  Problem Relation Age of Onset  . Stroke Mother   . Lung cancer Father   . Colon cancer Neg Hx   .  Colon polyps Neg Hx     Social History Social History   Tobacco Use  . Smoking status: Former Smoker    Packs/day: 0.50    Years: 22.00    Pack years: 11.00    Types: Cigarettes, Pipe    Start date: 07/26/1935    Quit date: 07/25/1957    Years since quitting: 61.8  . Smokeless tobacco: Never Used  Substance Use Topics  . Alcohol use: Yes    Alcohol/week: 0.0 standard drinks    Comment: Occasionally- few times a month  . Drug use: No     Allergies   Patient has no known allergies.   Review of Systems Review of Systems  All other systems reviewed and are negative.    Physical Exam Updated Vital Signs BP (!) 177/76   Pulse 88    Temp 97.9 F (36.6 C)   Resp 18   Ht 5\' 1"  (1.549 m)   Wt 65.8 kg   SpO2 100%   BMI 27.40 kg/m   Physical Exam Vitals signs and nursing note reviewed.  Constitutional:      Appearance: Normal appearance.     Comments: Elderly male who does not appear his age  HENT:     Head: Normocephalic and atraumatic.     Right Ear: External ear normal.     Left Ear: External ear normal.  Eyes:     Extraocular Movements: Extraocular movements intact.     Conjunctiva/sclera: Conjunctivae normal.  Neck:     Musculoskeletal: Normal range of motion.  Cardiovascular:     Rate and Rhythm: Normal rate.  Pulmonary:     Effort: Pulmonary effort is normal. No respiratory distress.  Abdominal:     General: Abdomen is flat.     Palpations: Abdomen is soft.     Tenderness: There is no abdominal tenderness.  Genitourinary:    Comments: Patient is holding his penis and writhing around.  When I look at his urethra there is a minimal amount of dried blood present.  When I look at his Foley catheter the balloon is deflated, I do not see any obvious rips or tears in the balloon.  His leg bag is empty. Musculoskeletal:        General: No deformity.  Skin:    General: Skin is warm and dry.     Coloration: Skin is pale.  Neurological:     General: No focal deficit present.     Mental Status: He is alert and oriented to person, place, and time.     Cranial Nerves: No cranial nerve deficit.  Psychiatric:        Mood and Affect: Mood normal.        Behavior: Behavior normal.        Thought Content: Thought content normal.      ED Treatments / Results  Labs (all labs ordered are listed, but only abnormal results are displayed) Labs Reviewed - No data to display  EKG None  Radiology No results found.  Procedures Procedures (including critical care time)  Medications Ordered in ED Medications - No data to display   Initial Impression / Assessment and Plan / ED Course  I have reviewed the  triage vital signs and the nursing notes.  Pertinent labs & imaging results that were available during my care of the patient were reviewed by me and considered in my medical decision making (see chart for details).        Bladder scan was  62, Foley catheter was reinserted.  Nurse reports catheter was easily inserted.  When I check he has out about 400 cc of clear nonbloody urine.  He feels better and is ready to be discharged.  Final Clinical Impressions(s) / ED Diagnoses   Final diagnoses:  Foley catheter problem, initial encounter [facility])    ED Discharge Orders    None      Plan discharge  Rolland Porter, MD, Barbette Or, MD 05/15/19 [phone]

## 2019-05-16 DIAGNOSIS — M159 Polyosteoarthritis, unspecified: Secondary | ICD-10-CM | POA: Diagnosis not present

## 2019-05-16 DIAGNOSIS — I10 Essential (primary) hypertension: Secondary | ICD-10-CM | POA: Diagnosis not present

## 2019-05-21 ENCOUNTER — Ambulatory Visit (INDEPENDENT_AMBULATORY_CARE_PROVIDER_SITE_OTHER): Payer: Medicare Other | Admitting: Urology

## 2019-05-21 DIAGNOSIS — R339 Retention of urine, unspecified: Secondary | ICD-10-CM | POA: Diagnosis not present

## 2019-06-03 DIAGNOSIS — R001 Bradycardia, unspecified: Secondary | ICD-10-CM | POA: Diagnosis not present

## 2019-06-03 DIAGNOSIS — Z950 Presence of cardiac pacemaker: Secondary | ICD-10-CM | POA: Diagnosis not present

## 2019-06-03 DIAGNOSIS — Z4502 Encounter for adjustment and management of automatic implantable cardiac defibrillator: Secondary | ICD-10-CM | POA: Diagnosis not present

## 2019-06-03 DIAGNOSIS — Z45018 Encounter for adjustment and management of other part of cardiac pacemaker: Secondary | ICD-10-CM | POA: Diagnosis not present

## 2019-06-11 DIAGNOSIS — Z961 Presence of intraocular lens: Secondary | ICD-10-CM | POA: Diagnosis not present

## 2019-06-11 DIAGNOSIS — H538 Other visual disturbances: Secondary | ICD-10-CM | POA: Diagnosis not present

## 2019-06-18 ENCOUNTER — Ambulatory Visit (INDEPENDENT_AMBULATORY_CARE_PROVIDER_SITE_OTHER): Payer: Medicare Other | Admitting: Urology

## 2019-06-18 ENCOUNTER — Other Ambulatory Visit: Payer: Self-pay

## 2019-06-18 DIAGNOSIS — C61 Malignant neoplasm of prostate: Secondary | ICD-10-CM

## 2019-06-21 ENCOUNTER — Encounter (HOSPITAL_COMMUNITY): Payer: Self-pay

## 2019-06-21 ENCOUNTER — Emergency Department (HOSPITAL_COMMUNITY): Payer: Medicare Other

## 2019-06-21 ENCOUNTER — Other Ambulatory Visit: Payer: Self-pay

## 2019-06-21 ENCOUNTER — Emergency Department (HOSPITAL_COMMUNITY)
Admission: EM | Admit: 2019-06-21 | Discharge: 2019-06-21 | Disposition: A | Discharge status: Home / Self Care | Payer: Medicare Other | Attending: Emergency Medicine | Admitting: Emergency Medicine

## 2019-06-21 DIAGNOSIS — M109 Gout, unspecified: Secondary | ICD-10-CM

## 2019-06-21 DIAGNOSIS — Z950 Presence of cardiac pacemaker: Secondary | ICD-10-CM | POA: Diagnosis not present

## 2019-06-21 DIAGNOSIS — Z79899 Other long term (current) drug therapy: Secondary | ICD-10-CM | POA: Insufficient documentation

## 2019-06-21 DIAGNOSIS — I129 Hypertensive chronic kidney disease with stage 1 through stage 4 chronic kidney disease, or unspecified chronic kidney disease: Secondary | ICD-10-CM | POA: Insufficient documentation

## 2019-06-21 DIAGNOSIS — N189 Chronic kidney disease, unspecified: Secondary | ICD-10-CM | POA: Diagnosis not present

## 2019-06-21 DIAGNOSIS — Z87891 Personal history of nicotine dependence: Secondary | ICD-10-CM | POA: Diagnosis not present

## 2019-06-21 DIAGNOSIS — M79644 Pain in right finger(s): Secondary | ICD-10-CM | POA: Diagnosis not present

## 2019-06-21 DIAGNOSIS — M79641 Pain in right hand: Secondary | ICD-10-CM | POA: Diagnosis present

## 2019-06-21 HISTORY — DX: Malignant (primary) neoplasm, unspecified: C80.1

## 2019-06-21 HISTORY — DX: Presence of other specified devices: Z97.8

## 2019-06-21 MED ORDER — PREDNISONE 20 MG PO TABS: 20.0000 mg | ORAL_TABLET | Freq: Two times a day (BID) | ORAL | 0 refills | Status: DC

## 2019-06-21 NOTE — ED Triage Notes (Signed)
Pt has a chronic knot on right thumb per family " a long time". Now thumb is sore and red worse since last night. Pt given tylenol

## 2019-06-21 NOTE — Discharge Instructions (Signed)
We sent a prescription for prednisone to your pharmacy to treat for gout.  Use heat on the sore area 3-4 times a day.  Follow-up with your doctor for checkup in a week or so, and ask him about preventive treatment for gout.

## 2019-06-21 NOTE — ED Provider Notes (Signed)
[facility] EMERGENCY DEPARTMENT Provider Note   CSN: [id number] Arrival date & time: 06/21/19  4098     History   Chief Complaint Chief Complaint  Patient presents with  . Hand Pain    HPI GILBERT MANOLIS is a 85 y.o. male.     HPI   He complains of pain swelling and redness in right thumb for several days.  Not at same site, with intermittent pain, chronically.  No fever, chills, nausea or vomiting.  No prior diagnosis of gout.  There are no other known modifying factors.  Past Medical History:  Diagnosis Date  . BPH (benign prostatic hyperplasia)   . Cancer [facility])    prostate cancer with metastasis to bone  . Chronic kidney disease   . Foley catheter in place   . Hypertension   . Presence of permanent cardiac pacemaker     Patient Active Problem List   Diagnosis Date Noted  . Constipation 03/13/2019  . Normocytic anemia due to blood loss 10/12/2017  . Chest pain 05/09/2013  . HTN (hypertension) 05/09/2013  . Mobitz (type) I [facility]) atrioventricular block 05/09/2013    Past Surgical History:  Procedure Laterality Date  . APPENDECTOMY    . COLONOSCOPY    . ESOPHAGOGASTRODUODENOSCOPY N/A 10/24/2017   Procedure: ESOPHAGOGASTRODUODENOSCOPY (EGD);  Surgeon: Danie Binder, MD;  Location: [facility] ENDO SUITE;  Service: Endoscopy;  Laterality: N/A;  2:45pm  . HERNIA REPAIR    . PACEMAKER IMPLANT          Home Medications    Prior to Admission medications   Medication Sig Start Date End Date Taking? Authorizing Provider  aliskiren (TEKTURNA) 150 MG tablet Take 150 mg by mouth daily.    [provider]  amoxicillin (AMOXIL) 500 MG capsule Take 2,000 mg by mouth as directed. One hour prior to dental appointments 01/19/15   [provider]  erythromycin ophthalmic ointment 1 application See admin instructions. Applied inside the bottom lower eyelids as directed daily 12/01/17   [provider]  finasteride (PROSCAR) 5 MG tablet Take 1  tablet by mouth daily. 01/24/15   [provider]  iron polysaccharides (NIFEREX) 150 MG capsule Take 1 capsule (150 mg total) by mouth daily. 03/13/19   Fields, Marga Melnick, MD  magnesium hydroxide (PHILLIPS MILK OF MAGNESIA) 311 MG CHEW chewable tablet 1 PO ONE TO THREE TIMES A DAY. 03/13/19   Fields, Marga Melnick, MD  Multiple Vitamins-Minerals (CENTRUM SILVER PO) Take 1 tablet by mouth daily.    [provider]  pantoprazole (PROTONIX) 40 MG tablet Take 1 tablet (40 mg total) by mouth daily. Patient not taking: Reported on 03/13/2019 04/19/18   Danie Binder, MD  polyethylene glycol powder (GLYCOLAX/MIRALAX) powder Take 17 g by mouth daily as needed. 04/04/13   [provider]  Polyvinyl Alcohol-Povidone (Wann OP) Apply to eye at bedtime.    [provider]  tamsulosin (FLOMAX) 0.4 MG CAPS capsule Take 0.8 mg by mouth daily.     [provider]    Family History Family History  Problem Relation Age of Onset  . Stroke Mother   . Lung cancer Father   . Colon cancer Neg Hx   . Colon polyps Neg Hx     Social History Social History   Tobacco Use  . Smoking status: Former Smoker    Packs/day: 0.50    Years: 22.00    Pack years: 11.00    Types: Cigarettes, Pipe  Start date: 07/26/1935    Quit date: 07/25/1957    Years since quitting: 61.9  . Smokeless tobacco: Never Used  Substance Use Topics  . Alcohol use: Yes    Alcohol/week: 0.0 standard drinks    Comment: Occasionally- few times a month  . Drug use: No     Allergies   Patient has no known allergies.   Review of Systems Review of Systems  All other systems reviewed and are negative.    Physical Exam Updated Vital Signs BP 126/69 (BP Location: Right Arm)   Pulse 61   Temp 97.8 F (36.6 C) (Oral)   Ht 5\' 2"  (1.575 m)   Wt 65.8 kg   SpO2 99%   BMI 26.52 kg/m   Physical Exam Vitals signs and nursing note reviewed.  Constitutional:      General: He is not in acute distress.     Appearance: He is well-developed. He is not ill-appearing, toxic-appearing or diaphoretic.  HENT:     Head: Normocephalic and atraumatic.     Right Ear: External ear normal.     Left Ear: External ear normal.  Eyes:     Conjunctiva/sclera: Conjunctivae normal.     Pupils: Pupils are equal, round, and reactive to light.  Neck:     Musculoskeletal: Normal range of motion and neck supple.     Trachea: Phonation normal.  Cardiovascular:     Rate and Rhythm: Normal rate.  Pulmonary:     Effort: Pulmonary effort is normal.  Musculoskeletal:     Comments: Right thumb, IP joint tender swollen and red dorsally with deformity consistent with tophus.  No proximal streaking.  Decreased range of motion, from, actively secondary to pain.  Skin:    General: Skin is warm and dry.  Neurological:     Mental Status: He is alert and oriented to person, place, and time.     Cranial Nerves: No cranial nerve deficit.     Sensory: No sensory deficit.     Motor: No abnormal muscle tone.     Coordination: Coordination normal.  Psychiatric:        Mood and Affect: Mood normal.        Behavior: Behavior normal.        Thought Content: Thought content normal.        Judgment: Judgment normal.      ED Treatments / Results  Labs (all labs ordered are listed, but only abnormal results are displayed) Labs Reviewed - No data to display  EKG None  Radiology Dg Finger Thumb Right  Result Date: 06/21/2019 CLINICAL DATA:  Right thumb redness and pain. EXAM: RIGHT THUMB 2+V COMPARISON:  None. FINDINGS: Dorsal and radial erosive changes of the first proximal phalanx head with overlying faintly mineralized soft tissue nodule. 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 Moderate first [place] joint osteoarthritis. No acute fracture or dislocation. Soft tissue swelling of the thumb. IMPRESSION: 1. Dorsal and radial erosive changes of the first proximal phalanx head with overlying faintly mineralized soft  tissue nodule. Findings suspicious for tophaceous gout. Electronically Signed   By: Titus Dubin M.D.   On: 06/21/2019 11:36    Procedures Procedures (including critical care time)  Medications Ordered in ED Medications - No data to display   Initial Impression / Assessment and Plan / ED Course  I have reviewed the triage vital signs and the nursing notes.  Pertinent labs & imaging results that were available during my care of the  patient were reviewed by me and considered in my medical decision making (see chart for details).        Patient Vitals for the past 24 hrs:  BP Temp Temp src Pulse Resp SpO2 Height Weight  06/21/19 1222 (!) 121/53 97.7 F (36.5 C) Oral (!) 59 16 100 % - -  06/21/19 1049 - - - - - - 5\' 2"  (1.575 m) 65.8 kg  06/21/19 1048 126/69 97.8 F (36.6 C) Oral 61 - 99 % - -    At D/C- Reevaluation with update and discussion. After initial assessment and treatment, an updated evaluation reveals no additional complaints.  Findings discussed and questions answered Daleen Bo   Medical Decision Making: On swelling, redness and pain, evaluation consistent with gout.  Doubt fracture, tendinopathy or osteomyelitis.  MAKELL CYR was evaluated in Emergency Department on 06/22/2019 for the symptoms described in the history of present illness. He was evaluated in the context of the global COVID-19 pandemic, which necessitated consideration that the patient might be at risk for infection with the SARS-CoV-2 virus that causes COVID-19. Institutional protocols and algorithms that pertain to the evaluation of patients at risk for COVID-19 are in a state of rapid change based on information released by regulatory bodies including the CDC and federal and state organizations. These policies and algorithms were followed during the patient's care in the ED.   CRITICAL CARE-no Performed by: Daleen Bo   Nursing Notes Reviewed/ Care Coordinated Applicable Imaging  Reviewed Interpretation of Laboratory Data incorporated into ED treatment  The patient appears reasonably screened and/or stabilized for discharge and I doubt any other medical condition or other [facility] requiring further screening, evaluation, or treatment in the ED at this time prior to discharge.  Plan: Home Medications-usual home medications; Home Treatments-heat to affected area; return here if the recommended treatment, does not improve the symptoms; Recommended follow up-PCP checkup 1 to 2 weeks consider ongoing management for gout at that time.   Final Clinical Impressions(s) / ED Diagnoses   Final diagnoses:  Acute gout of right hand, unspecified cause    ED Discharge Orders    None       Daleen Bo, MD 06/22/19 1002

## 2019-06-27 DIAGNOSIS — C61 Malignant neoplasm of prostate: Secondary | ICD-10-CM | POA: Diagnosis not present

## 2019-07-05 DIAGNOSIS — I10 Essential (primary) hypertension: Secondary | ICD-10-CM | POA: Diagnosis not present

## 2019-07-05 DIAGNOSIS — M159 Polyosteoarthritis, unspecified: Secondary | ICD-10-CM | POA: Diagnosis not present

## 2019-07-24 ENCOUNTER — Other Ambulatory Visit: Payer: Self-pay

## 2019-07-24 ENCOUNTER — Ambulatory Visit (INDEPENDENT_AMBULATORY_CARE_PROVIDER_SITE_OTHER): Payer: Medicare Other

## 2019-07-24 VITALS — BP 145/56 | HR 60 | Temp 97.5°F | Ht 62.0 in | Wt 140.0 lb

## 2019-07-24 DIAGNOSIS — C61 Malignant neoplasm of prostate: Secondary | ICD-10-CM

## 2019-07-24 DIAGNOSIS — R338 Other retention of urine: Secondary | ICD-10-CM | POA: Diagnosis not present

## 2019-07-24 DIAGNOSIS — N401 Enlarged prostate with lower urinary tract symptoms: Secondary | ICD-10-CM

## 2019-07-24 MED ORDER — DENOSUMAB 120 MG/1.7ML ~~LOC~~ SOLN
120.0000 mg | Freq: Once | SUBCUTANEOUS | Status: AC
  Administered 2019-07-24: 17:00:00 120 mg via SUBCUTANEOUS

## 2019-07-24 NOTE — Addendum Note (Signed)
Addended by: Valentina Lucks on: 07/24/2019 04:57 PM   Modules accepted: Orders

## 2019-07-24 NOTE — Progress Notes (Signed)
,  Cath Change/ Replacement  Patient is present today for a catheter change due to urinary retention.  84ml of water was removed from the balloon, a 16FR foley cath was removed with out difficulty.  Patient was cleaned and prepped in a sterile fashion with betadine and 2% lidocaine jelly was instilled into the urethra. A 16 FR foley cath was replaced into the bladder no complications were noted Urine return was noted 80ml and urine was yellow in color. The balloon was filled with 83ml of sterile water. A leg bag was attached for drainage.  A night bag was also given to the patient and patient was given instruction on how to change from one bag to another. Patient was given proper instruction on catheter care.    Performed by: dmerchant,lpn  Follow up: 1 mth cath change and Xgeva

## 2019-07-25 ENCOUNTER — Other Ambulatory Visit: Payer: Self-pay

## 2019-08-20 ENCOUNTER — Other Ambulatory Visit: Payer: Self-pay

## 2019-08-20 ENCOUNTER — Telehealth: Payer: Self-pay | Admitting: Gastroenterology

## 2019-08-20 ENCOUNTER — Ambulatory Visit (INDEPENDENT_AMBULATORY_CARE_PROVIDER_SITE_OTHER): Payer: Medicare Other

## 2019-08-20 ENCOUNTER — Other Ambulatory Visit (HOSPITAL_COMMUNITY)
Admission: RE | Admit: 2019-08-20 | Discharge: 2019-08-20 | Disposition: A | Discharge status: Home / Self Care | Payer: Medicare Other | Source: Ambulatory Visit | Attending: Gastroenterology | Admitting: Gastroenterology

## 2019-08-20 DIAGNOSIS — R5383 Other fatigue: Secondary | ICD-10-CM | POA: Diagnosis not present

## 2019-08-20 DIAGNOSIS — D649 Anemia, unspecified: Secondary | ICD-10-CM

## 2019-08-20 DIAGNOSIS — C61 Malignant neoplasm of prostate: Secondary | ICD-10-CM

## 2019-08-20 LAB — IRON AND TIBC
Iron: 41 ug/dL — ABNORMAL LOW (ref 45–182)
Saturation Ratios: 15 % — ABNORMAL LOW (ref 17.9–39.5)
TIBC: 274 ug/dL (ref 250–450)
UIBC: 233 ug/dL

## 2019-08-20 LAB — CBC WITH DIFFERENTIAL/PLATELET
Abs Immature Granulocytes: 0.05 10*3/uL (ref 0.00–0.07)
Basophils Absolute: 0.1 10*3/uL (ref 0.0–0.1)
Basophils Relative: 1 %
Eosinophils Absolute: 0.7 10*3/uL — ABNORMAL HIGH (ref 0.0–0.5)
Eosinophils Relative: 9 %
HCT: 36.3 % — ABNORMAL LOW (ref 39.0–52.0)
Hemoglobin: 11.4 g/dL — ABNORMAL LOW (ref 13.0–17.0)
Immature Granulocytes: 1 %
Lymphocytes Relative: 20 %
Lymphs Abs: 1.4 10*3/uL (ref 0.7–4.0)
MCH: 31 pg (ref 26.0–34.0)
MCHC: 31.4 g/dL (ref 30.0–36.0)
MCV: 98.6 fL (ref 80.0–100.0)
Monocytes Absolute: 0.8 10*3/uL (ref 0.1–1.0)
Monocytes Relative: 11 %
Neutro Abs: 4.2 10*3/uL (ref 1.7–7.7)
Neutrophils Relative %: 58 %
Platelets: 335 10*3/uL (ref 150–400)
RBC: 3.68 MIL/uL — ABNORMAL LOW (ref 4.22–5.81)
RDW: 13.4 % (ref 11.5–15.5)
WBC: 7.2 10*3/uL (ref 4.0–10.5)
nRBC: 0 % (ref 0.0–0.2)

## 2019-08-20 LAB — FERRITIN: Ferritin: 138 ng/mL (ref 24–336)

## 2019-08-20 MED ORDER — DENOSUMAB 120 MG/1.7ML ~~LOC~~ SOLN
120.0000 mg | Freq: Once | SUBCUTANEOUS | Status: AC
  Administered 2019-08-20: 120 mg via SUBCUTANEOUS

## 2019-08-20 NOTE — Telephone Encounter (Signed)
I would not schedule an office visit yet. Fatigue is very vague. Many different causes. Lets see what his labs show first.   Doris, I see I only requested iron. I meant iron panel (with ferritin).

## 2019-08-20 NOTE — Telephone Encounter (Signed)
I called pt, many rings and no answer. I called his emergency contact, Kizzie Furnish. She said that one of his doctors took him off of his iron. The last couple of weeks he has been showing signs of getting weaker. This morning he was more weak and could not take the trash to the road.  She said she does not want him to get so weak that he passes out again like he did once when they just got to the ED. She said he is not that bad yet, but would like to get some labs done if possible and maybe come in for an OV. Sending to Aliene Altes, [place] in Dr. Oneida Alar absence.

## 2019-08-20 NOTE — Telephone Encounter (Signed)
See lab result note dated 08/20/19

## 2019-08-20 NOTE — Telephone Encounter (Signed)
I have faxed the iron panel over and spoke to [facility] at the lab and she has received them and knows to do the panel and not just the iron.

## 2019-08-20 NOTE — Telephone Encounter (Signed)
[place] HIS IRON, HE IS VERY WEAK

## 2019-08-20 NOTE — Telephone Encounter (Signed)
He has not had any rectal bleeding. They will go to the lab at [facility] this afternoon. They want to know if he needs an appointment here right away?

## 2019-08-20 NOTE — Progress Notes (Signed)
Cath Change/ Replacement  Patient is present today for a catheter change due to urinary retention.  89ml of water was removed from the balloon, a 16FR foley cath was removed with out difficulty.  Patient was cleaned and prepped in a sterile fashion with betadine. A 16 FR foley cath was replaced into the bladder no complications were noted Urine return was noted 28ml and urine was yellow in color. The balloon was filled with 70ml of sterile water. A leg bag was attached for drainage. Patient was given proper instruction on catheter care.    Performed by: Valentina Lucks lpn  Follow up: 33month for cath change and Delton See

## 2019-08-20 NOTE — Telephone Encounter (Signed)
Any bright red blood per rectum, melena, or hematemesis? Last labs in August 2020 with hemoglobin 12.8 and much improved ferritin. Lets go ahead and update CBC and iron today if possible. Further recommendations to follow. If he has significant fatigue, feels lightheaded, dizzy, or like he may pass out, they should proceed to the emergency room.

## 2019-08-21 DIAGNOSIS — I10 Essential (primary) hypertension: Secondary | ICD-10-CM | POA: Diagnosis not present

## 2019-08-21 DIAGNOSIS — Z6825 Body mass index (BMI) 25.0-25.9, adult: Secondary | ICD-10-CM | POA: Diagnosis not present

## 2019-08-21 DIAGNOSIS — Z299 Encounter for prophylactic measures, unspecified: Secondary | ICD-10-CM | POA: Diagnosis not present

## 2019-08-21 DIAGNOSIS — F0390 Unspecified dementia without behavioral disturbance: Secondary | ICD-10-CM | POA: Diagnosis not present

## 2019-08-21 DIAGNOSIS — K219 Gastro-esophageal reflux disease without esophagitis: Secondary | ICD-10-CM | POA: Diagnosis not present

## 2019-08-21 DIAGNOSIS — N184 Chronic kidney disease, stage 4 (severe): Secondary | ICD-10-CM | POA: Diagnosis not present

## 2019-08-21 DIAGNOSIS — Z789 Other specified health status: Secondary | ICD-10-CM | POA: Diagnosis not present

## 2019-08-26 DIAGNOSIS — Z950 Presence of cardiac pacemaker: Secondary | ICD-10-CM | POA: Diagnosis not present

## 2019-08-26 DIAGNOSIS — R001 Bradycardia, unspecified: Secondary | ICD-10-CM | POA: Diagnosis not present

## 2019-08-26 DIAGNOSIS — Z45018 Encounter for adjustment and management of other part of cardiac pacemaker: Secondary | ICD-10-CM | POA: Diagnosis not present

## 2019-08-29 DIAGNOSIS — I10 Essential (primary) hypertension: Secondary | ICD-10-CM | POA: Diagnosis not present

## 2019-08-29 DIAGNOSIS — R972 Elevated prostate specific antigen [PSA]: Secondary | ICD-10-CM | POA: Diagnosis not present

## 2019-08-29 DIAGNOSIS — Z789 Other specified health status: Secondary | ICD-10-CM | POA: Diagnosis not present

## 2019-08-29 DIAGNOSIS — M109 Gout, unspecified: Secondary | ICD-10-CM | POA: Diagnosis not present

## 2019-08-29 DIAGNOSIS — Z6825 Body mass index (BMI) 25.0-25.9, adult: Secondary | ICD-10-CM | POA: Diagnosis not present

## 2019-08-29 DIAGNOSIS — Z299 Encounter for prophylactic measures, unspecified: Secondary | ICD-10-CM | POA: Diagnosis not present

## 2019-08-29 DIAGNOSIS — N184 Chronic kidney disease, stage 4 (severe): Secondary | ICD-10-CM | POA: Diagnosis not present

## 2019-09-05 DIAGNOSIS — Z23 Encounter for immunization: Secondary | ICD-10-CM | POA: Diagnosis not present

## 2019-09-09 DIAGNOSIS — I10 Essential (primary) hypertension: Secondary | ICD-10-CM | POA: Diagnosis not present

## 2019-09-09 DIAGNOSIS — M159 Polyosteoarthritis, unspecified: Secondary | ICD-10-CM | POA: Diagnosis not present

## 2019-09-17 ENCOUNTER — Ambulatory Visit: Payer: Medicare Other | Admitting: Urology

## 2019-09-24 ENCOUNTER — Other Ambulatory Visit: Payer: Self-pay | Admitting: Urology

## 2019-09-24 ENCOUNTER — Ambulatory Visit (INDEPENDENT_AMBULATORY_CARE_PROVIDER_SITE_OTHER): Payer: Medicare Other | Admitting: Urology

## 2019-09-24 ENCOUNTER — Other Ambulatory Visit: Payer: Self-pay

## 2019-09-24 ENCOUNTER — Encounter: Payer: Self-pay | Admitting: Urology

## 2019-09-24 VITALS — BP 154/52 | HR 72 | Temp 97.3°F | Ht 62.0 in | Wt 141.0 lb

## 2019-09-24 DIAGNOSIS — C61 Malignant neoplasm of prostate: Secondary | ICD-10-CM

## 2019-09-24 MED ORDER — DENOSUMAB 120 MG/1.7ML ~~LOC~~ SOLN
120.0000 mg | Freq: Once | SUBCUTANEOUS | Status: AC
  Administered 2019-09-24: 120 mg via SUBCUTANEOUS

## 2019-09-24 NOTE — Progress Notes (Signed)
H&P  Chief Complaint: Prostate Cancer  History of Present Illness:   3.2.2021: Here today for cath change, xgeva, and follow-up. He remains on indwelling cath, alternating sides for leg bag to help reduce some of the irritation it is causing. His caregiver has also been applying gauze pads and neosporin to keep this area comfortable.   (below copied from [facility] records):  Prostate Cancer: Austin Jacobs is a 86 year-old male established patient who is here evaluation for treatment of prostate cancer.  He does have the pathology report from his biopsy. His PSA at his time of diagnosis was 21.   He has not undergone surgery for treatment. He has not undergone External Beam Radiation Therapy for treatment. He has undergone Hormonal Therapy for treatment.   He does not have urinary incontinence. He has not recently had unwanted weight loss. He is not having pain in new locations.   10.20.2020: Austin Jacobs is here today primarily for nurse visit for firmagon injection. He asks today about the timing of removal of his catheter -- he has been apparently tolerating this less well lately.   06/18/2019: Energy good. He got dental clearance. He is due for eligard     06/27/2019 02/25/19  Total PSA 10.8 ng/dl 21.8 ng/dl    Urinary Retention: His problem was diagnosed 05/14/2019. His current symptoms did not begin after he had a surgical procedure. His urinary retention is being treated with foley catheter. Patient denies suprapubic tube, intemittent catheterization, flomax, hytrin, cardura, uroxatrol, rapaflo, avodart, and proscar.   He does not have an abnormal sensation when needing to urinate. He does not have to strain or bear down to start his urinary stream. He does not have a good size and strength to his urinary stream. He is having problems with emptying his bladder well. His urine has shut off completely.   He is not having problems with urinary control or incontinence.   He has previously  had an indwelling catheter in for more than two weeks at a time.    Past Medical History:  Diagnosis Date  . BPH (benign prostatic hyperplasia)   . Cancer [facility])    prostate cancer with metastasis to bone  . Chronic kidney disease   . Foley catheter in place   . Hypertension   . Presence of permanent cardiac pacemaker     Past Surgical History:  Procedure Laterality Date  . APPENDECTOMY    . COLONOSCOPY    . ESOPHAGOGASTRODUODENOSCOPY N/A 10/24/2017   Procedure: ESOPHAGOGASTRODUODENOSCOPY (EGD);  Surgeon: Danie Binder, MD;  Location: [facility] ENDO SUITE;  Service: Endoscopy;  Laterality: N/A;  2:45pm  . HERNIA REPAIR    . PACEMAKER IMPLANT      Home Medications:  Allergies as of 09/24/2019   No Known Allergies     Medication List       Accurate as of September 24, 2019 11:18 AM. If you have any questions, ask your nurse or doctor.        aliskiren 150 MG tablet Commonly known as: TEKTURNA Take 150 mg by mouth daily.   amoxicillin 500 MG capsule Commonly known as: AMOXIL Take 2,000 mg by mouth as directed. One hour prior to dental appointments   CENTRUM SILVER PO Take 1 tablet by mouth daily.   erythromycin ophthalmic ointment 1 application See admin instructions. Applied inside the bottom lower eyelids as directed daily   finasteride 5 MG tablet Commonly known as: PROSCAR Take 1 tablet by mouth daily.  iron polysaccharides 150 MG capsule Commonly known as: NIFEREX Take 1 capsule (150 mg total) by mouth daily.   magnesium hydroxide 311 MG Chew chewable tablet Commonly known as: Phillips Milk of Magnesia 1 PO ONE TO THREE TIMES A DAY.   Myrbetriq 25 MG Tb24 tablet Generic drug: mirabegron ER Take 25 mg by mouth daily.   pantoprazole 40 MG tablet Commonly known as: PROTONIX Take 1 tablet (40 mg total) by mouth daily.   polyethylene glycol powder 17 GM/SCOOP powder Commonly known as: GLYCOLAX/MIRALAX Take 17 g by mouth daily as needed.   predniSONE 20 MG  tablet Commonly known as: DELTASONE Take 1 tablet (20 mg total) by mouth 2 (two) times daily.   REFRESH OP Apply to eye at bedtime.   tamsulosin 0.4 MG Caps capsule Commonly known as: FLOMAX Take by mouth.   tamsulosin 0.4 MG Caps capsule Commonly known as: FLOMAX Take 0.8 mg by mouth daily.       Allergies: No Known Allergies  Family History  Problem Relation Age of Onset  . Stroke Mother   . Lung cancer Father   . Colon cancer Neg Hx   . Colon polyps Neg Hx     Social History:  reports that he quit smoking about 62 years ago. His smoking use included cigarettes and pipe. He started smoking about 84 years ago. He has a 11.00 pack-year smoking history. He has never used smokeless tobacco. He reports current alcohol use. He reports that he does not use drugs.  ROS: A complete review of systems was performed.  All systems are negative except for pertinent findings as noted.  Physical Exam:  Vital signs in last 24 hours: BP (!) 154/52   Pulse 72   Temp (!) 97.3 F (36.3 C)   Ht 5\' 2"  (1.575 m)   Wt 141 lb (64 kg)   BMI 25.79 kg/m  Constitutional:  Alert and oriented, No acute distress Cardiovascular: Regular rate  Respiratory: Normal respiratory effort GI: Abdomen is soft, nontender, nondistended, no abdominal masses. No CVAT.  Genitourinary: Normal male phallus (uncircumcised), testes are descended bilaterally and non-tender and without masses and atrophic, scrotum is normal in appearance without lesions or masses, perineum is normal on inspection. Prostate feels around 30 g in size, left sided nodule around 8 mm. Catheter present. Lymphatic: No lymphadenopathy Neurologic: Grossly intact, no focal deficits Psychiatric: Normal mood and affect   I have reviewed prior pt notes  I have reviewed notes from referring/previous physicians  I have reviewed prior PSA results  Impression/Assessment:  He continues to do well with catheter and is seeming to maintain a good  quality of life with respect to his urinary sx's. It may also help to apply some neosporin to the catheter right as it exits his penis to help reduce some of the pain/irritation it causes. His prostate feels improved in condition since his last exam.   Will recheck PSA today.  Plan:  1. Return in 3 mo's for OV w/ PSA and 6 mo lupron  2. PSA today -- will forward results.  3. Xgeva given and catheter changed today without issue.   4. Recommended that he may try Debrox for rinsing out his ears.

## 2019-09-24 NOTE — Progress Notes (Signed)
Cath Change/ Replacement  Patient is present today for a catheter change due to urinary retention.  10 ml of water was removed from the balloon, a 16 FR foley cath was removed with out difficulty.  Patient was cleaned and prepped in a sterile fashion with betadine. A 16 FR foley cath was replaced into the bladder no complications were noted Urine return was noted 10 ml and urine was yellow in color. The balloon was filled with 68ml of sterile water. A leg bag was attached for drainage.   Patient was given proper instruction on catheter care.    Performed by: H. Cobb LPN  Follow up: 4 wk cath chang NV    Urological Symptom Review  Patient is experiencing the following symptoms: Burning/pain with urination Penile pain (male only)    Review of Systems  Gastrointestinal (upper)  : Negative for upper GI symptoms  Gastrointestinal (lower) : Negative for lower GI symptoms  Constitutional : Negative for symptoms  Skin: Negative for skin symptoms  Eyes: Negative for eye symptoms  Ear/Nose/Throat : Negative for Ear/Nose/Throat symptoms  Hematologic/Lymphatic: Negative for Hematologic/Lymphatic symptoms  Cardiovascular : Negative for cardiovascular symptoms  Respiratory : Negative for respiratory symptoms  Endocrine: Negative for endocrine symptoms  Musculoskeletal: Back pain  Neurological: Negative for neurological symptoms  Psychologic: Negative for psychiatric symptoms

## 2019-09-25 LAB — TESTOSTERONE: Testosterone: <10 ng/dL — ABNORMAL LOW (ref 250–827)

## 2019-09-25 LAB — PSA: PSA: 8.1 ng/mL — ABNORMAL HIGH (ref <=4.0)

## 2019-09-26 ENCOUNTER — Telehealth: Payer: Self-pay

## 2019-09-26 NOTE — Telephone Encounter (Signed)
-----   Message from Franchot Gallo, MD sent at 09/26/2019 11:24 AM EST ----- Let pt know that PSA is down to 8--good news ----- Message ----- From: Dorisann Frames, RN Sent: 09/25/2019   9:29 AM EST To: Franchot Gallo, MD  Please review

## 2019-09-26 NOTE — Telephone Encounter (Signed)
Results mailed 

## 2019-10-04 DIAGNOSIS — Z23 Encounter for immunization: Secondary | ICD-10-CM | POA: Diagnosis not present

## 2019-10-04 DIAGNOSIS — M159 Polyosteoarthritis, unspecified: Secondary | ICD-10-CM | POA: Diagnosis not present

## 2019-10-04 DIAGNOSIS — I10 Essential (primary) hypertension: Secondary | ICD-10-CM | POA: Diagnosis not present

## 2019-10-07 DIAGNOSIS — I10 Essential (primary) hypertension: Secondary | ICD-10-CM | POA: Diagnosis not present

## 2019-10-07 DIAGNOSIS — F0390 Unspecified dementia without behavioral disturbance: Secondary | ICD-10-CM | POA: Diagnosis not present

## 2019-10-07 DIAGNOSIS — R972 Elevated prostate specific antigen [PSA]: Secondary | ICD-10-CM | POA: Diagnosis not present

## 2019-10-07 DIAGNOSIS — Z299 Encounter for prophylactic measures, unspecified: Secondary | ICD-10-CM | POA: Diagnosis not present

## 2019-10-07 DIAGNOSIS — H6123 Impacted cerumen, bilateral: Secondary | ICD-10-CM | POA: Diagnosis not present

## 2019-10-23 ENCOUNTER — Other Ambulatory Visit: Payer: Self-pay

## 2019-10-23 ENCOUNTER — Ambulatory Visit: Payer: Medicare Other

## 2019-10-23 ENCOUNTER — Telehealth: Payer: Self-pay | Admitting: Urology

## 2019-10-23 DIAGNOSIS — N401 Enlarged prostate with lower urinary tract symptoms: Secondary | ICD-10-CM

## 2019-10-23 DIAGNOSIS — R338 Other retention of urine: Secondary | ICD-10-CM

## 2019-10-23 NOTE — Telephone Encounter (Signed)
Austin Jacobs called on behalf of patient, she stated the pt cath is leaking and she needs to know what to do. Wishes for a call back

## 2019-10-23 NOTE — Telephone Encounter (Signed)
Pt will come today for NV

## 2019-10-25 ENCOUNTER — Ambulatory Visit: Payer: Medicare Other

## 2019-10-28 NOTE — Progress Notes (Signed)
Cath Change/ Replacement  Patient is present today for a catheter change due to urinary retention.  33ml of water was removed from the balloon, a 16FR foley cath was removed with out difficulty.  Patient was cleaned and prepped in a sterile fashion with betadine. A 16 FR foley cath was replaced into the bladder no complications were noted Urine return was noted 34ml and urine was yellow in color. The balloon was filled with 74ml of sterile water. A leg bag was attached for drainage.  A night bag was also given to the patient and patient was given instruction on how to change from one bag to another. Patient was given proper instruction on catheter care.    Performed by: H.Reggie Bise LPN  Follow up: keep next week appt. 1 month cath change w/ xgeva

## 2019-10-30 ENCOUNTER — Ambulatory Visit (INDEPENDENT_AMBULATORY_CARE_PROVIDER_SITE_OTHER): Payer: Medicare Other

## 2019-10-30 ENCOUNTER — Other Ambulatory Visit: Payer: Self-pay

## 2019-10-30 DIAGNOSIS — C61 Malignant neoplasm of prostate: Secondary | ICD-10-CM | POA: Diagnosis not present

## 2019-10-30 MED ORDER — DENOSUMAB 120 MG/1.7ML ~~LOC~~ SOLN
120.0000 mg | Freq: Once | SUBCUTANEOUS | Status: AC
  Administered 2019-10-30: 120 mg via SUBCUTANEOUS

## 2019-11-22 ENCOUNTER — Ambulatory Visit (INDEPENDENT_AMBULATORY_CARE_PROVIDER_SITE_OTHER): Payer: Medicare Other

## 2019-11-22 ENCOUNTER — Other Ambulatory Visit: Payer: Self-pay

## 2019-11-22 DIAGNOSIS — R338 Other retention of urine: Secondary | ICD-10-CM

## 2019-11-22 DIAGNOSIS — N401 Enlarged prostate with lower urinary tract symptoms: Secondary | ICD-10-CM

## 2019-11-22 NOTE — Progress Notes (Signed)
Pt. C/o of leg bag and/or cath leaking. After looking at bag with Hope,Lpn we found the leg bag was torn at the top near the port for the balloon. His leg bag was replaced. Pts catregiver also asked for an rx for the old type of leg bag we used. They do not like the new ones. Dr. Jeffie Pollock wrote rx for these.

## 2019-11-25 DIAGNOSIS — Z950 Presence of cardiac pacemaker: Secondary | ICD-10-CM | POA: Diagnosis not present

## 2019-11-25 DIAGNOSIS — Z45010 Encounter for checking and testing of cardiac pacemaker pulse generator [battery]: Secondary | ICD-10-CM | POA: Diagnosis not present

## 2019-11-25 DIAGNOSIS — R001 Bradycardia, unspecified: Secondary | ICD-10-CM | POA: Diagnosis not present

## 2019-11-25 DIAGNOSIS — Z45018 Encounter for adjustment and management of other part of cardiac pacemaker: Secondary | ICD-10-CM | POA: Diagnosis not present

## 2019-12-03 ENCOUNTER — Other Ambulatory Visit: Payer: Self-pay

## 2019-12-03 ENCOUNTER — Ambulatory Visit (INDEPENDENT_AMBULATORY_CARE_PROVIDER_SITE_OTHER): Payer: Medicare Other

## 2019-12-03 DIAGNOSIS — C61 Malignant neoplasm of prostate: Secondary | ICD-10-CM | POA: Diagnosis not present

## 2019-12-03 MED ORDER — DENOSUMAB 120 MG/1.7ML ~~LOC~~ SOLN
120.0000 mg | Freq: Once | SUBCUTANEOUS | Status: AC
  Administered 2019-12-03: 120 mg via SUBCUTANEOUS

## 2019-12-03 NOTE — Progress Notes (Signed)
Cath Change/ Replacement  Patient is present today for a catheter change due to urinary retention.  64ml of water was removed from the balloon, a 16 FR foley cath was removed with out difficulty.  Patient was cleaned and prepped in a sterile fashion with betadine. A 16 FR foley cath was replaced into the bladder no complications were noted Urine return was noted 10 ml and urine was yellow in color. The balloon was filled with 64ml of sterile water. A leg bag was attached for drainage.   Patient was given proper instruction on catheter care.    Performed by: Tammie Ellsworth LPN   Follow up: 1 month

## 2019-12-15 ENCOUNTER — Emergency Department (HOSPITAL_COMMUNITY): Payer: Medicare Other

## 2019-12-15 ENCOUNTER — Other Ambulatory Visit: Payer: Self-pay

## 2019-12-15 ENCOUNTER — Encounter (HOSPITAL_COMMUNITY): Payer: Self-pay | Admitting: Emergency Medicine

## 2019-12-15 ENCOUNTER — Inpatient Hospital Stay (HOSPITAL_COMMUNITY)
Admission: EM | Admit: 2019-12-15 | Discharge: 2019-12-15 | DRG: 316 | Disposition: A | Discharge status: Other Acute Inpatient Hospital | Payer: Medicare Other | Attending: Emergency Medicine | Admitting: Emergency Medicine

## 2019-12-15 DIAGNOSIS — Z8546 Personal history of malignant neoplasm of prostate: Secondary | ICD-10-CM

## 2019-12-15 DIAGNOSIS — Z79899 Other long term (current) drug therapy: Secondary | ICD-10-CM | POA: Diagnosis not present

## 2019-12-15 DIAGNOSIS — R55 Syncope and collapse: Secondary | ICD-10-CM

## 2019-12-15 DIAGNOSIS — I2510 Atherosclerotic heart disease of native coronary artery without angina pectoris: Secondary | ICD-10-CM | POA: Diagnosis present

## 2019-12-15 DIAGNOSIS — Z20822 Contact with and (suspected) exposure to covid-19: Secondary | ICD-10-CM | POA: Diagnosis present

## 2019-12-15 DIAGNOSIS — Z006 Encounter for examination for normal comparison and control in clinical research program: Secondary | ICD-10-CM | POA: Diagnosis not present

## 2019-12-15 DIAGNOSIS — Z87891 Personal history of nicotine dependence: Secondary | ICD-10-CM

## 2019-12-15 DIAGNOSIS — Y831 Surgical operation with implant of artificial internal device as the cause of abnormal reaction of the patient, or of later complication, without mention of misadventure at the time of the procedure: Secondary | ICD-10-CM | POA: Diagnosis present

## 2019-12-15 DIAGNOSIS — Z801 Family history of malignant neoplasm of trachea, bronchus and lung: Secondary | ICD-10-CM

## 2019-12-15 DIAGNOSIS — R42 Dizziness and giddiness: Secondary | ICD-10-CM | POA: Diagnosis not present

## 2019-12-15 DIAGNOSIS — Z823 Family history of stroke: Secondary | ICD-10-CM | POA: Diagnosis not present

## 2019-12-15 DIAGNOSIS — T82110A Breakdown (mechanical) of cardiac electrode, initial encounter: Secondary | ICD-10-CM | POA: Diagnosis present

## 2019-12-15 DIAGNOSIS — D509 Iron deficiency anemia, unspecified: Secondary | ICD-10-CM | POA: Diagnosis present

## 2019-12-15 DIAGNOSIS — I10 Essential (primary) hypertension: Secondary | ICD-10-CM | POA: Diagnosis not present

## 2019-12-15 DIAGNOSIS — R001 Bradycardia, unspecified: Secondary | ICD-10-CM | POA: Diagnosis not present

## 2019-12-15 DIAGNOSIS — N400 Enlarged prostate without lower urinary tract symptoms: Secondary | ICD-10-CM | POA: Diagnosis present

## 2019-12-15 DIAGNOSIS — I351 Nonrheumatic aortic (valve) insufficiency: Secondary | ICD-10-CM | POA: Diagnosis present

## 2019-12-15 DIAGNOSIS — N183 Chronic kidney disease, stage 3 unspecified: Secondary | ICD-10-CM | POA: Diagnosis present

## 2019-12-15 DIAGNOSIS — T829XXA Unspecified complication of cardiac and vascular prosthetic device, implant and graft, initial encounter: Principal | ICD-10-CM | POA: Diagnosis present

## 2019-12-15 DIAGNOSIS — I340 Nonrheumatic mitral (valve) insufficiency: Secondary | ICD-10-CM | POA: Diagnosis present

## 2019-12-15 DIAGNOSIS — I361 Nonrheumatic tricuspid (valve) insufficiency: Secondary | ICD-10-CM | POA: Diagnosis present

## 2019-12-15 DIAGNOSIS — Z950 Presence of cardiac pacemaker: Secondary | ICD-10-CM

## 2019-12-15 DIAGNOSIS — I129 Hypertensive chronic kidney disease with stage 1 through stage 4 chronic kidney disease, or unspecified chronic kidney disease: Secondary | ICD-10-CM | POA: Diagnosis present

## 2019-12-15 DIAGNOSIS — I442 Atrioventricular block, complete: Secondary | ICD-10-CM | POA: Diagnosis present

## 2019-12-15 DIAGNOSIS — T82118A Breakdown (mechanical) of other cardiac electronic device, initial encounter: Secondary | ICD-10-CM | POA: Diagnosis not present

## 2019-12-15 LAB — CBG MONITORING, ED: Glucose-Capillary: 150 mg/dL — ABNORMAL HIGH (ref 70–99)

## 2019-12-15 LAB — URINALYSIS, ROUTINE W REFLEX MICROSCOPIC
Bilirubin Urine: NEGATIVE
Glucose, UA: NEGATIVE mg/dL
Hgb urine dipstick: NEGATIVE
Ketones, ur: NEGATIVE mg/dL
Nitrite: NEGATIVE
Protein, ur: NEGATIVE mg/dL
Specific Gravity, Urine: 1.014 (ref 1.005–1.030)
pH: 7 (ref 5.0–8.0)

## 2019-12-15 LAB — CBC
HCT: 34.5 % — ABNORMAL LOW (ref 39.0–52.0)
Hemoglobin: 11 g/dL — ABNORMAL LOW (ref 13.0–17.0)
MCH: 31.2 pg (ref 26.0–34.0)
MCHC: 31.9 g/dL (ref 30.0–36.0)
MCV: 97.7 fL (ref 80.0–100.0)
Platelets: 220 10*3/uL (ref 150–400)
RBC: 3.53 MIL/uL — ABNORMAL LOW (ref 4.22–5.81)
RDW: 14.7 % (ref 11.5–15.5)
WBC: 6.1 10*3/uL (ref 4.0–10.5)
nRBC: 0 % (ref 0.0–0.2)

## 2019-12-15 LAB — TROPONIN I (HIGH SENSITIVITY)
Troponin I (High Sensitivity): 8 ng/L (ref <18)
Troponin I (High Sensitivity): 8 ng/L (ref <18)

## 2019-12-15 LAB — BASIC METABOLIC PANEL
Anion gap: 9 (ref 5–15)
BUN: 44 mg/dL — ABNORMAL HIGH (ref 8–23)
CO2: 20 mmol/L — ABNORMAL LOW (ref 22–32)
Calcium: 8.5 mg/dL — ABNORMAL LOW (ref 8.9–10.3)
Chloride: 112 mmol/L — ABNORMAL HIGH (ref 98–111)
Creatinine, Ser: 2.39 mg/dL — ABNORMAL HIGH (ref 0.61–1.24)
GFR calc Af Amer: 25 mL/min — ABNORMAL LOW (ref >60)
GFR calc non Af Amer: 21 mL/min — ABNORMAL LOW (ref >60)
Glucose, Bld: 159 mg/dL — ABNORMAL HIGH (ref 70–99)
Potassium: 5.1 mmol/L (ref 3.5–5.1)
Sodium: 141 mmol/L (ref 135–145)

## 2019-12-15 LAB — SARS CORONAVIRUS 2 BY RT PCR (HOSPITAL ORDER, PERFORMED IN ~~LOC~~ HOSPITAL LAB): SARS Coronavirus 2: NEGATIVE

## 2019-12-15 NOTE — ED Notes (Signed)
Pt noted to brady down into HR 30  Pacer noted not picking up   Request for Dr Earnest Conroy to come evaluate

## 2019-12-15 NOTE — ED Notes (Signed)
Call from Austin Jacobs from [employer]  Pt has a fracture of the bifasciular lead of pacemaker  The noise is confusing the pacemaker and she would like to know the plan for this pt   If he is going to be transferred to [facility]?

## 2019-12-15 NOTE — ED Notes (Signed)
covid spec in lab

## 2019-12-15 NOTE — ED Provider Notes (Signed)
[facility] EMERGENCY DEPARTMENT Provider Note   CSN: [id number] Arrival date & time: 12/15/19  4098     History Chief Complaint  Patient presents with  . Near Syncope    Austin Jacobs is a 86 y.o. male.  Patient not feeling well since about 2 weeks ago.  Low energy.  Today he had a near syncopal episode where he almost passed out.  He did not fall he did not get hurt.  Patient is known to have a pacemaker dual pacemaker followed by cardiology at [facility] Dr. Maylon Peppers, patient denies any chest pain or any shortness of breath.  Patient has a pacemaker for symptomatic bradycardia.        Past Medical History:  Diagnosis Date  . BPH (benign prostatic hyperplasia)   . Cancer [facility])    prostate cancer with metastasis to bone  . Chronic kidney disease   . Foley catheter in place   . Hypertension   . Presence of permanent cardiac pacemaker     Patient Active Problem List   Diagnosis Date Noted  . Constipation 03/13/2019  . Normocytic anemia due to blood loss 10/12/2017  . Chest pain 05/09/2013  . HTN (hypertension) 05/09/2013  . Mobitz (type) I [facility]) atrioventricular block 05/09/2013    Past Surgical History:  Procedure Laterality Date  . APPENDECTOMY    . COLONOSCOPY    . ESOPHAGOGASTRODUODENOSCOPY N/A 10/24/2017   Procedure: ESOPHAGOGASTRODUODENOSCOPY (EGD);  Surgeon: Danie Binder, MD;  Location: [facility] ENDO SUITE;  Service: Endoscopy;  Laterality: N/A;  2:45pm  . HERNIA REPAIR    . PACEMAKER IMPLANT         Family History  Problem Relation Age of Onset  . Stroke Mother   . Lung cancer Father   . Colon cancer Neg Hx   . Colon polyps Neg Hx     Social History   Tobacco Use  . Smoking status: Former Smoker    Packs/day: 0.50    Years: 22.00    Pack years: 11.00    Types: Cigarettes, Pipe    Start date: 07/26/1935    Quit date: 07/25/1957    Years since quitting: 62.4  . Smokeless tobacco: Never Used  Substance Use Topics  . Alcohol use: Yes   Alcohol/week: 0.0 standard drinks    Comment: Occasionally- few times a month  . Drug use: No    Home Medications Prior to Admission medications   Medication Sig Start Date End Date Taking? Authorizing Provider  aliskiren (TEKTURNA) 150 MG tablet Take 150 mg by mouth daily.   Yes [provider]  amoxicillin (AMOXIL) 500 MG capsule Take 2,000 mg by mouth as directed. One hour prior to dental appointments 01/19/15  Yes [provider]  Multiple Vitamins-Minerals (CENTRUM SILVER PO) Take 1 tablet by mouth daily.   Yes [provider]  erythromycin ophthalmic ointment 1 application See admin instructions. Applied inside the bottom lower eyelids as directed daily 12/01/17   [provider]  iron polysaccharides (NIFEREX) 150 MG capsule Take 1 capsule (150 mg total) by mouth daily. Patient not taking: Reported on 12/15/2019 03/13/19   Danie Binder, MD  magnesium hydroxide (PHILLIPS MILK OF MAGNESIA) 311 MG CHEW chewable tablet 1 PO ONE TO THREE TIMES A DAY. Patient not taking: Reported on 09/24/2019 03/13/19   Danie Binder, MD  pantoprazole (PROTONIX) 40 MG tablet Take 1 tablet (40 mg total) by mouth daily. Patient not taking: Reported on 03/13/2019 04/19/18   Barney Drain  L, MD  predniSONE (DELTASONE) 20 MG tablet Take 1 tablet (20 mg total) by mouth 2 (two) times daily. Patient not taking: Reported on 09/24/2019 06/21/19   Daleen Bo, MD    Allergies    Patient has no known allergies.  Review of Systems   Review of Systems  Constitutional: Positive for fatigue. Negative for chills and fever.  HENT: Negative for rhinorrhea and sore throat.   Eyes: Negative for visual disturbance.  Respiratory: Negative for cough and shortness of breath.   Cardiovascular: Negative for chest pain and leg swelling.  Gastrointestinal: Negative for abdominal pain, diarrhea, nausea and vomiting.  Genitourinary: Negative for dysuria.  Musculoskeletal: Negative for back pain  and neck pain.  Skin: Negative for rash.  Neurological: Negative for dizziness, weakness, light-headedness and headaches.  Hematological: Does not bruise/bleed easily.  Psychiatric/Behavioral: Negative for confusion.    Physical Exam Updated Vital Signs BP (!) 144/41   Pulse 61   Temp 98.3 F (36.8 C) (Oral)   Resp 15   Ht 1.575 m (5\' 2" )   Wt 65.8 kg   SpO2 100%   BMI 26.52 kg/m   Physical Exam Vitals and nursing note reviewed.  Constitutional:      Appearance: Normal appearance. He is well-developed.  HENT:     Head: Normocephalic and atraumatic.  Eyes:     Extraocular Movements: Extraocular movements intact.     Conjunctiva/sclera: Conjunctivae normal.     Pupils: Pupils are equal, round, and reactive to light.  Cardiovascular:     Rate and Rhythm: Bradycardia present. Rhythm irregular.     Heart sounds: No murmur.  Pulmonary:     Effort: Pulmonary effort is normal. No respiratory distress.     Breath sounds: Normal breath sounds.  Abdominal:     Palpations: Abdomen is soft.     Tenderness: There is no abdominal tenderness.  Musculoskeletal:        General: No swelling. Normal range of motion.     Cervical back: Neck supple.  Skin:    General: Skin is warm and dry.  Neurological:     General: No focal deficit present.     Mental Status: He is alert and oriented to person, place, and time.     ED Results / Procedures / Treatments   Labs (all labs ordered are listed, but only abnormal results are displayed) Labs Reviewed  BASIC METABOLIC PANEL - Abnormal; Notable for the following components:      Result Value   Chloride 112 (*)    CO2 20 (*)    Glucose, Bld 159 (*)    BUN 44 (*)    Creatinine, Ser 2.39 (*)    Calcium 8.5 (*)    GFR calc non Af Amer 21 (*)    GFR calc Af Amer 25 (*)    All other components within normal limits  CBC - Abnormal; Notable for the following components:   RBC 3.53 (*)    Hemoglobin 11.0 (*)    HCT 34.5 (*)    All other  components within normal limits  URINALYSIS, ROUTINE W REFLEX MICROSCOPIC - Abnormal; Notable for the following components:   APPearance HAZY (*)    Leukocytes,Ua LARGE (*)    Bacteria, UA RARE (*)    All other components within normal limits  CBG MONITORING, ED - Abnormal; Notable for the following components:   Glucose-Capillary 150 (*)    All other components within normal limits  SARS CORONAVIRUS 2 BY RT  PCR [facility] ORDER, [place] LAB)  TROPONIN I (HIGH SENSITIVITY)  TROPONIN I (HIGH SENSITIVITY)    EKG EKG Interpretation  Date/Time:  Sunday Dec 15 2019 13:37:55 EDT Ventricular Rate:  116 PR Interval:    QRS Duration: 132 QT Interval:  291 QTC Calculation: 352 R Axis:   -94 Text Interpretation: Atrial fibrillation Paired ventricular premature complexes Nonspecific IVCD with LAD Nonspecific T abnormalities, diffuse leads Confirmed by Fredia Sorrow [phone]) on 12/15/2019 4:44:32 PM   Radiology DG Chest Port 1 View  Result Date: 12/15/2019 CLINICAL DATA:  Recent weakness and near syncopal episode EXAM: PORTABLE CHEST 1 VIEW COMPARISON:  10/19/2016 FINDINGS: Cardiac shadow is enlarged. New pacemaker is noted. The lungs are well aerated bilaterally. Mild vascular congestion is seen. No sizable infiltrate or effusion is noted. Calcified granulomas are noted. IMPRESSION: Changes of prior granulomatous disease. Mild vascular congestion. Electronically Signed   By: Inez Catalina M.D.   On: 12/15/2019 14:24    Procedures Procedures (including critical care time)   CRITICAL CARE Performed by: Fredia Sorrow Total critical care time: 35 minutes Critical care time was exclusive of separately billable procedures and treating other patients. Critical care was necessary to treat or prevent imminent or life-threatening deterioration. Critical care was time spent personally by me on the following activities: development of treatment plan with patient and/or  surrogate as well as nursing, discussions with consultants, evaluation of patient's response to treatment, examination of patient, obtaining history from patient or surrogate, ordering and performing treatments and interventions, ordering and review of laboratory studies, ordering and review of radiographic studies, pulse oximetry and re-evaluation of patient's condition.  Medications Ordered in ED Medications - No data to display  ED Course  I have reviewed the triage vital signs and the nursing notes.  Pertinent labs & imaging results that were available during my care of the patient were reviewed by me and considered in my medical decision making (see chart for details).    MDM Rules/Calculators/A&P                       Cardiac monitoring shows that patient's pacemaker is probably not working properly.  Could see some periods of some pacer spikes but then they would stop and heart rate would go down to 29.  Had kind of like an escape beat.  Then this pacer spikes would come back again.  His twelve-lead EKG does not make that real clear.  The pacer was interrogated its a [employer] pacemaker and the report implies that there is probably the lead to the right ventricle was not functioning properly may be partially broken working sometimes but not working other times.  [facility] cardiology since that is where his cardiologist is, normally followed by Maylon Peppers I spoke with the on-call cardiologist for devices Dr. Maryann Alar his cell number is [phone].  Plan was to have the [place] technician come out and try to reprogram the pacemaker into a VOO mode which would stabilize things and then patient may not require ICU admission because [facility] had no ICU admissions.  If that does not work and patient may have to go on the list for admission at [facility] or we may have to rechat with cardiology at [facility] which I already spoke to the cardiologist at [facility].  Not sure if we have the appropriate bed for  the patient there as well.  Both cardiologist both at [facility] at [facility] felt patient definitely required admission and  was not stable to go home.  Patient's lab work-up here without any significant findings.  And patient's been very stable and tolerated the bradycardia as long as he is laying down.    Final Clinical Impression(s) / ED Diagnoses Final diagnoses:  Bradycardia  Near syncope  Disorder of cardiac pacemaker system, initial encounter    Rx / DC Orders ED Discharge Orders    None       Fredia Sorrow, MD 12/15/19 1710

## 2019-12-15 NOTE — ED Notes (Signed)
[facility] line called to page Cardiologist on call at this time for Dr. Rogene Houston.

## 2019-12-15 NOTE — ED Notes (Signed)
Pt with hx of Prostate Ca Foley by Urology (Dr Manuella Ghazi)  Reports near syncopal episode  After going outside to feed the birds  He reports he did not fall and feels fine now   Here for eval

## 2019-12-15 NOTE — ED Notes (Signed)
Pacer interrogated.   

## 2019-12-15 NOTE — ED Notes (Signed)
Spouse home for rest meal   [phone]   Pt awaiting bed assignment and transfer

## 2019-12-15 NOTE — ED Notes (Signed)
metronic tech unable to Scientist, research (physical sciences)  She discussed w Dr Rogene Houston

## 2019-12-15 NOTE — ED Triage Notes (Signed)
CBG in ED:150

## 2019-12-15 NOTE — ED Notes (Signed)
Per Dr Tollie Pizza  No appropriate beds at [place] to try to reprogram pacer here   If successful may stay here , if not then will have to go to [facility]

## 2019-12-15 NOTE — ED Notes (Signed)
Spoke with Ruby at [facility] will page Cardiologist on call for Dr. Rogene Houston.

## 2019-12-15 NOTE — ED Provider Notes (Signed)
Patient with malfunctioning pacemaker.  Originally he was seen to be admitted to [facility] because [facility] did not have any beds available.  Arrangements were made to send him to [facility] but while he was waiting to go to [facility] called back and did have a CCU bed available so they will come admit the patient Dr.Bhave excepting   Milton Ferguson, MD 12/15/19 [phone]

## 2019-12-15 NOTE — ED Notes (Signed)
Pacer was placed at [facility]  Attempt to interrogate pacer

## 2019-12-15 NOTE — ED Triage Notes (Signed)
Pt presents today states he ate breakfast, went to feed the birds and when he turned around to go inside he suddenly felt light headed and felt he was about to pass out and had to grab onto the wall. Pt did not pass out or fall. Pt states he feels fine now.

## 2019-12-20 DIAGNOSIS — N184 Chronic kidney disease, stage 4 (severe): Secondary | ICD-10-CM | POA: Diagnosis not present

## 2019-12-20 DIAGNOSIS — C61 Malignant neoplasm of prostate: Secondary | ICD-10-CM | POA: Diagnosis not present

## 2019-12-20 DIAGNOSIS — I10 Essential (primary) hypertension: Secondary | ICD-10-CM | POA: Diagnosis not present

## 2019-12-20 DIAGNOSIS — Z299 Encounter for prophylactic measures, unspecified: Secondary | ICD-10-CM | POA: Diagnosis not present

## 2019-12-20 DIAGNOSIS — F0390 Unspecified dementia without behavioral disturbance: Secondary | ICD-10-CM | POA: Diagnosis not present

## 2019-12-20 DIAGNOSIS — C7951 Secondary malignant neoplasm of bone: Secondary | ICD-10-CM | POA: Diagnosis not present

## 2019-12-22 DIAGNOSIS — M159 Polyosteoarthritis, unspecified: Secondary | ICD-10-CM | POA: Diagnosis not present

## 2019-12-22 DIAGNOSIS — I10 Essential (primary) hypertension: Secondary | ICD-10-CM | POA: Diagnosis not present

## 2019-12-31 ENCOUNTER — Ambulatory Visit: Payer: Medicare Other | Admitting: Urology

## 2020-01-06 DIAGNOSIS — M79672 Pain in left foot: Secondary | ICD-10-CM | POA: Diagnosis not present

## 2020-01-06 DIAGNOSIS — L600 Ingrowing nail: Secondary | ICD-10-CM | POA: Diagnosis not present

## 2020-01-06 DIAGNOSIS — L03032 Cellulitis of left toe: Secondary | ICD-10-CM | POA: Diagnosis not present

## 2020-01-06 DIAGNOSIS — I739 Peripheral vascular disease, unspecified: Secondary | ICD-10-CM | POA: Diagnosis not present

## 2020-01-06 DIAGNOSIS — L03031 Cellulitis of right toe: Secondary | ICD-10-CM | POA: Diagnosis not present

## 2020-01-06 DIAGNOSIS — M79671 Pain in right foot: Secondary | ICD-10-CM | POA: Diagnosis not present

## 2020-01-07 ENCOUNTER — Ambulatory Visit (INDEPENDENT_AMBULATORY_CARE_PROVIDER_SITE_OTHER): Payer: Medicare Other | Admitting: Urology

## 2020-01-07 ENCOUNTER — Ambulatory Visit: Payer: Medicare Other | Admitting: Urology

## 2020-01-07 ENCOUNTER — Other Ambulatory Visit: Payer: Self-pay

## 2020-01-07 VITALS — BP 112/61 | HR 76 | Temp 97.0°F | Ht 62.0 in | Wt 140.5 lb

## 2020-01-07 DIAGNOSIS — C61 Malignant neoplasm of prostate: Secondary | ICD-10-CM

## 2020-01-07 LAB — PSA: PSA: 45.9 ng/mL — ABNORMAL HIGH (ref <=4.0)

## 2020-01-07 LAB — TESTOSTERONE: Testosterone: <10 ng/dL — ABNORMAL LOW (ref 250–827)

## 2020-01-07 MED ORDER — DENOSUMAB 120 MG/1.7ML ~~LOC~~ SOLN
120.0000 mg | Freq: Once | SUBCUTANEOUS | Status: AC
  Administered 2020-01-07: 120 mg via SUBCUTANEOUS

## 2020-01-07 MED ORDER — LEUPROLIDE ACETATE (6 MONTH) 45 MG IM KIT
45.0000 mg | PACK | Freq: Once | INTRAMUSCULAR | Status: AC
  Administered 2020-01-07: 45 mg via INTRAMUSCULAR

## 2020-01-07 NOTE — Progress Notes (Signed)
Cath Change/ Replacement  Patient is present today for a catheter change due to urinary retention.  41ml of water was removed from the balloon, a 16FR foley cath was removed with out difficulty.  Patient was cleaned and prepped in a sterile fashion with betadine. A 16 FR foley cath was replaced into the bladder no complications were noted Urine return was noted 87ml and urine was light yellow in color. The balloon was filled with 32ml of sterile water. A leg bag was attached for drainage.  Patient was given proper instruction on catheter care.    Performed by: Hope Cobb LPN  Follow up: 1 month cath change  Urological Symptom Review  Patient is experiencing the following symptoms: none   Review of Systems  Gastrointestinal (upper)  : Negative for upper GI symptoms  Gastrointestinal (lower) : Negative for lower GI symptoms  Constitutional : Night Sweats  Skin: Itching  Eyes: Negative for eye symptoms  Ear/Nose/Throat : Negative for Ear/Nose/Throat symptoms  Hematologic/Lymphatic: Easy bruising  Cardiovascular : Negative for cardiovascular symptoms  Respiratory : Negative for respiratory symptoms  Endocrine: Negative for endocrine symptoms  Musculoskeletal: Back pain  Neurological: Negative for neurological symptoms  Psychologic: Negative for psychiatric symptoms

## 2020-01-07 NOTE — Progress Notes (Signed)
H&P  Chief Complaint: Prostate Cancer  History of Present Illness:   6.15.2021: Here today for follow-up w/ cath change and lupron. Due labs today. He has had some chronic issues with intermittent back pain for the last year but this has recently worsened -- this is finally being adequately managed with back brace. Also of note, he had recent ER visit for pacemaker repair and has had very low energy since. He reports that he has been tolerating catheter well recently but has apparently has fairly regularly pain when draining through it.    Past Medical History:  Diagnosis Date   BPH (benign prostatic hyperplasia)    Cancer (HCC)    prostate cancer with metastasis to bone   Chronic kidney disease    Foley catheter in place    Hypertension    Presence of permanent cardiac pacemaker     Past Surgical History:  Procedure Laterality Date   APPENDECTOMY     COLONOSCOPY     ESOPHAGOGASTRODUODENOSCOPY N/A 10/24/2017   Procedure: ESOPHAGOGASTRODUODENOSCOPY (EGD);  Surgeon: Danie Binder, MD;  Location: [facility] ENDO SUITE;  Service: Endoscopy;  Laterality: N/A;  2:45pm   HERNIA REPAIR     PACEMAKER IMPLANT      Home Medications:  Allergies as of 01/07/2020   No Known Allergies     Medication List       Accurate as of January 07, 2020 12:00 PM. If you have any questions, ask your nurse or doctor.        aliskiren 150 MG tablet Commonly known as: TEKTURNA Take 150 mg by mouth daily.   amoxicillin 500 MG capsule Commonly known as: AMOXIL Take 2,000 mg by mouth as directed. One hour prior to dental appointments   CENTRUM SILVER PO Take 1 tablet by mouth daily.   econazole nitrate 1 % cream   erythromycin ophthalmic ointment 1 application See admin instructions. Applied inside the bottom lower eyelids as directed daily   iron polysaccharides 150 MG capsule Commonly known as: NIFEREX Take 1 capsule (150 mg total) by mouth daily.   leuprolide 30 MG injection Commonly  known as: LUPRON Inject into the muscle.   magnesium hydroxide 311 MG Chew chewable tablet Commonly known as: Phillips Milk of Magnesia 1 PO ONE TO THREE TIMES A DAY.   pantoprazole 40 MG tablet Commonly known as: PROTONIX Take 1 tablet (40 mg total) by mouth daily.   predniSONE 20 MG tablet Commonly known as: DELTASONE Take 1 tablet (20 mg total) by mouth 2 (two) times daily.       Allergies: No Known Allergies  Family History  Problem Relation Age of Onset   Stroke Mother    Lung cancer Father    Colon cancer Neg Hx    Colon polyps Neg Hx     Social History:  reports that he quit smoking about 62 years ago. His smoking use included cigarettes and pipe. He started smoking about 84 years ago. He has a 11.00 pack-year smoking history. He has never used smokeless tobacco. He reports current alcohol use. He reports that he does not use drugs.  ROS: A complete review of systems was performed.  All systems are negative except for pertinent findings as noted.  Physical Exam:  Vital signs in last 24 hours: BP 112/61    Pulse 76    Temp (!) 97 F (36.1 C)    Ht 5\' 2"  (1.575 m)    Wt 140 lb 8 oz (63.7 kg)  BMI 25.70 kg/m  Constitutional:  Alert and oriented, No acute distress Cardiovascular: Regular rate  Respiratory: Normal respiratory effort GI: Abdomen is soft, nontender, nondistended, no abdominal masses. No CVAT.  Genitourinary: Not completed. Lymphatic: No lymphadenopathy Neurologic: Grossly intact, no focal deficits Psychiatric: Normal mood and affect  Laboratory Data:  No results for input(s): WBC, HGB, HCT, PLT in the last 72 hours.  No results for input(s): NA, K, CL, GLUCOSE, BUN, CALCIUM, CREATININE in the last 72 hours.  Invalid input(s): CO3   No results found for this or any previous visit (from the past 24 hour(s)). No results found for this or any previous visit (from the past 240 hour(s)).  Renal Function: No results for input(s): CREATININE  in the last 168 hours. CrCl cannot be calculated (Patient's most recent lab result is older than the maximum 21 days allowed.).  Radiologic Imaging: No results found.  Impression/Assessment:  Due repeat labs today. Lupron given and cath changed. He does have some pain with his catheter and this is more than likely due to some bladder spasms. Will start on month long trial of myrbetriq.  Will continue to follow with regular imaging (CT and bone scan). At some point, we will likely need to consider starting him on a medication like xtandi or zytiga.   Plan:  1. Start on trial of myrbetriq.  2. Continue with monthly NV for cath change. Will also discuss whether he would like to continue myrbetriq at this visit.   3. PSA and testosterone today -- will forward results.  4. Will call to schedule CT and bone scan.   5. Xgeva and  6 mo Lupron given today.   6. Return for OV in 3 mo w/ labs

## 2020-01-12 ENCOUNTER — Emergency Department (HOSPITAL_COMMUNITY)
Admission: EM | Admit: 2020-01-12 | Discharge: 2020-01-12 | Disposition: A | Discharge status: Home / Self Care | Payer: Medicare Other | Attending: Emergency Medicine | Admitting: Emergency Medicine

## 2020-01-12 ENCOUNTER — Emergency Department (HOSPITAL_COMMUNITY): Payer: Medicare Other

## 2020-01-12 ENCOUNTER — Other Ambulatory Visit: Payer: Self-pay

## 2020-01-12 ENCOUNTER — Encounter (HOSPITAL_COMMUNITY): Payer: Self-pay

## 2020-01-12 DIAGNOSIS — K5900 Constipation, unspecified: Secondary | ICD-10-CM | POA: Diagnosis present

## 2020-01-12 DIAGNOSIS — R39198 Other difficulties with micturition: Secondary | ICD-10-CM | POA: Diagnosis not present

## 2020-01-12 DIAGNOSIS — M549 Dorsalgia, unspecified: Secondary | ICD-10-CM | POA: Diagnosis not present

## 2020-01-12 DIAGNOSIS — Z87891 Personal history of nicotine dependence: Secondary | ICD-10-CM | POA: Insufficient documentation

## 2020-01-12 DIAGNOSIS — Z85830 Personal history of malignant neoplasm of bone: Secondary | ICD-10-CM | POA: Diagnosis not present

## 2020-01-12 DIAGNOSIS — Z79899 Other long term (current) drug therapy: Secondary | ICD-10-CM | POA: Insufficient documentation

## 2020-01-12 DIAGNOSIS — R5383 Other fatigue: Secondary | ICD-10-CM | POA: Diagnosis not present

## 2020-01-12 DIAGNOSIS — Z950 Presence of cardiac pacemaker: Secondary | ICD-10-CM | POA: Insufficient documentation

## 2020-01-12 MED ORDER — MINERAL OIL RE ENEM
1.0000 | ENEMA | Freq: Once | RECTAL | Status: DC
  Filled 2020-01-12: qty 1

## 2020-01-12 MED ORDER — MINERAL OIL RE ENEM
1.0000 | ENEMA | Freq: Once | RECTAL | Status: AC
  Administered 2020-01-12: 1 via RECTAL
  Filled 2020-01-12: qty 1

## 2020-01-12 MED ORDER — SENNOSIDES-DOCUSATE SODIUM 8.6-50 MG PO TABS: 1.0000 | ORAL_TABLET | Freq: Every day | ORAL | 0 refills | Status: AC

## 2020-01-12 NOTE — Discharge Instructions (Signed)
You can take tylenol 650 mg every 8 hours as needed for pain at home.

## 2020-01-12 NOTE — ED Triage Notes (Addendum)
Pt's friend reports pt has prostate cancer that has spread to his bone.  Reports has had back pain recently and unable to have a BM.  LBM was Thursday.  Friend says pt is scheduled for 2  scans at [facility] tomorrow.   Pt alert and oriented, ambulatory with cane.  Reports had new pacemaker placed 3 weeks ago.   Pt recently started taking iron otc.

## 2020-01-12 NOTE — ED Notes (Signed)
Pt had very small amount of clear liquid from rectum after mineral oil enema.

## 2020-01-12 NOTE — ED Provider Notes (Signed)
[facility] EMERGENCY DEPARTMENT Provider Note   CSN: [id number] Arrival date & time: 01/12/20  1324     History Chief Complaint  Patient presents with  . Constipation    Austin Jacobs is a 86 y.o. male with history of metastatic prostate cancer with bony metastases, presenting to emergency department with constipation.  Patient reports that he was started on iron pills 2 days ago.  However 4 days ago was his last bowel movement.  He says normally has bowel movement once every day or every other day.  He is not taking any laxatives at home, but did try a dose of MiraLAX yesterday.  He denies any abdominal pain but is having pain in his lower back.  He was able to pass a small amount of gas.  He denies any nausea or vomiting.  He has had problems with constipation in the past requiring ED visits.  They cannot recall the last visit.  He says "they fixed me right up."  He has multiple doctor visits coming up including a full body bone scan at [facility] tomorrow.  HPI     Past Medical History:  Diagnosis Date  . BPH (benign prostatic hyperplasia)   . Cancer [facility])    prostate cancer with metastasis to bone  . Chronic kidney disease   . Foley catheter in place   . Hypertension   . Presence of permanent cardiac pacemaker     Patient Active Problem List   Diagnosis Date Noted  . Bradycardia 12/15/2019  . Constipation 03/13/2019  . Normocytic anemia due to blood loss 10/12/2017  . Chest pain 05/09/2013  . HTN (hypertension) 05/09/2013  . Mobitz (type) I [facility]) atrioventricular block 05/09/2013    Past Surgical History:  Procedure Laterality Date  . APPENDECTOMY    . COLONOSCOPY    . ESOPHAGOGASTRODUODENOSCOPY N/A 10/24/2017   Procedure: ESOPHAGOGASTRODUODENOSCOPY (EGD);  Surgeon: Danie Binder, MD;  Location: [facility] ENDO SUITE;  Service: Endoscopy;  Laterality: N/A;  2:45pm  . HERNIA REPAIR    . PACEMAKER IMPLANT         Family History  Problem Relation Age of  Onset  . Stroke Mother   . Lung cancer Father   . Colon cancer Neg Hx   . Colon polyps Neg Hx     Social History   Tobacco Use  . Smoking status: Former Smoker    Packs/day: 0.50    Years: 22.00    Pack years: 11.00    Types: Cigarettes, Pipe    Start date: 07/26/1935    Quit date: 07/25/1957    Years since quitting: 62.5  . Smokeless tobacco: Never Used  Vaping Use  . Vaping Use: Never used  Substance Use Topics  . Alcohol use: Yes    Alcohol/week: 0.0 standard drinks    Comment: Occasionally- few times a month  . Drug use: No    Home Medications Prior to Admission medications   Medication Sig Start Date End Date Taking? Authorizing Provider  aliskiren (TEKTURNA) 150 MG tablet Take 150 mg by mouth daily.   Yes [provider]  leuprolide (LUPRON) 30 MG injection Inject into the muscle.   Yes [provider]  magnesium hydroxide (PHILLIPS MILK OF MAGNESIA) 311 MG CHEW chewable tablet 1 PO ONE TO THREE TIMES A DAY. 03/13/19  Yes Fields, Sandi L, MD  Multiple Vitamins-Minerals (CENTRUM SILVER PO) Take 1 tablet by mouth daily.   Yes [provider]  iron polysaccharides (NIFEREX) 150  MG capsule Take 1 capsule (150 mg total) by mouth daily. 03/13/19 01/12/20 Yes Fields, Sandi L, MD  amoxicillin (AMOXIL) 500 MG capsule Take 2,000 mg by mouth as directed. One hour prior to dental appointments Patient not taking: Reported on 01/07/2020 01/19/15   [provider]  econazole nitrate 1 % cream  01/06/20   [provider]  erythromycin ophthalmic ointment 1 application See admin instructions. Applied inside the bottom lower eyelids as directed daily Patient not taking: Reported on 01/07/2020 12/01/17   [provider]  pantoprazole (PROTONIX) 40 MG tablet Take 1 tablet (40 mg total) by mouth daily. Patient not taking: Reported on 03/13/2019 04/19/18   Danie Binder, MD  predniSONE (DELTASONE) 20 MG tablet Take 1 tablet (20 mg total) by mouth 2  (two) times daily. Patient not taking: Reported on 09/24/2019 06/21/19   Daleen Bo, MD  senna-docusate (SENOKOT-S) 8.6-50 MG tablet Take 1 tablet by mouth daily. 01/12/20 02/11/20  Wyvonnia Dusky, MD    Allergies    Patient has no known allergies.  Review of Systems   Review of Systems  Constitutional: Positive for fatigue. Negative for chills and fever.  HENT: Negative for ear pain and sore throat.   Eyes: Negative for pain and visual disturbance.  Respiratory: Negative for cough and shortness of breath.   Cardiovascular: Negative for chest pain and palpitations.  Gastrointestinal: Positive for constipation. Negative for abdominal pain, nausea and vomiting.  Genitourinary: Positive for difficulty urinating. Negative for dysuria.  Musculoskeletal: Positive for arthralgias and back pain.  Skin: Negative for color change and rash.  Neurological: Negative for syncope and headaches.  Psychiatric/Behavioral: Negative for agitation and confusion.  All other systems reviewed and are negative.   Physical Exam Updated Vital Signs BP 124/63 (BP Location: Left Arm)   Pulse 88   Temp 97.9 F (36.6 C) (Oral)   Resp 18   Ht 5\' 2"  (1.575 m)   Wt 63 kg   SpO2 100%   BMI 25.40 kg/m   Physical Exam Vitals and nursing note reviewed.  Constitutional:      Appearance: He is well-developed.  HENT:     Head: Normocephalic and atraumatic.  Eyes:     Conjunctiva/sclera: Conjunctivae normal.     Pupils: Pupils are equal, round, and reactive to light.  Cardiovascular:     Rate and Rhythm: Normal rate and regular rhythm.     Pulses: Normal pulses.  Pulmonary:     Effort: Pulmonary effort is normal. No respiratory distress.  Abdominal:     General: There is distension.     Palpations: Abdomen is soft. There is no mass.     Tenderness: There is no abdominal tenderness. There is no guarding.  Genitourinary:    Comments: Indwelling foley catheter Musculoskeletal:     Cervical back: Neck  supple.  Skin:    General: Skin is warm and dry.  Neurological:     General: No focal deficit present.     Mental Status: He is alert and oriented to person, place, and time.     Motor: No weakness.  Psychiatric:        Mood and Affect: Mood normal.        Behavior: Behavior normal.     ED Results / Procedures / Treatments   Labs (all labs ordered are listed, but only abnormal results are displayed) Labs Reviewed - No data to display  EKG EKG Interpretation  Date/Time:  Sunday January 12 2020 07:57:30  EDT Ventricular Rate:  90 PR Interval:    QRS Duration: 126 QT Interval:  395 QTC Calculation: 484 R Axis:   -37 Text Interpretation: Left bundle branch block Ventricular Paced rhythm, no sig change from prior tracing Confirmed by Octaviano Glow [phone]) on 01/12/2020 8:07:19 AM   Radiology CT ABDOMEN PELVIS WO CONTRAST  Result Date: 01/12/2020 CLINICAL DATA:  Abdominal distension and constipation. Possible obstructing abdominal mass. Chronic kidney disease. EXAM: CT ABDOMEN AND PELVIS WITHOUT CONTRAST TECHNIQUE: Multidetector CT imaging of the abdomen and pelvis was performed following the standard protocol without IV contrast. COMPARISON:  04/25/2019 and 04-Jun-202017 FINDINGS: Lower chest: Calcified granuloma over the left lower lobe and right middle lobe. Noncalcified subpleural 3 mm nodule over the posterior right lower lobe unchanged. Mild subpleural interstitial thickening over the lung bases unchanged. Calcified plaque over the descending thoracic aorta. Calcified plaque over the left lateral circumflex coronary artery. Cardiac pacer leads are present. Hepatobiliary: Liver, gallbladder and biliary tree are normal. Pancreas: Normal. Spleen: Normal. Adrenals/Urinary Tract: Adrenal glands are normal. Kidneys at the lower limits of normal in size without hydronephrosis. 3 mm stone over the upper pole right kidney unchanged. Bilateral renal cysts with the larger measuring 4.6 cm over the  left mid to lower pararenal region unchanged. Ureters are normal. Foley catheter is present within the bladder. Stomach/Bowel: Stomach and small bowel are normal. Previous appendectomy. Colon is normal with mild fecal retention. Vascular/Lymphatic: Moderate calcified plaque over the abdominal aorta which is normal in caliber. Significant calcified plaque at the origin/proximal right renal artery. No adenopathy. Reproductive: Normal. Other: No free fluid or focal inflammatory change. Musculoskeletal: Numerous sclerotic foci throughout the bony structures with interval progression compatible patient's known metastatic prostate cancer. IMPRESSION: 1. No acute findings in the abdomen/pelvis. No evidence of bowel obstruction. 2. Bilateral renal cysts unchanged. Nonobstructing 3 mm right renal stone unchanged. 3. Aortic Atherosclerosis (ICD10-I70.0). Atherosclerotic coronary artery disease. 4. Diffuse sclerotic foci throughout the bony structures with interval progression compatible with known metastatic prostate cancer. Electronically Signed   By: Marin Olp M.D.   On: 01/12/2020 09:32    Procedures Procedures (including critical care time)  Medications Ordered in ED Medications  mineral oil enema 1 enema (1 enema Rectal Given 01/12/20 1027)    ED Course  I have reviewed the triage vital signs and the nursing notes.  Pertinent labs & imaging results that were available during my care of the patient were reviewed by me and considered in my medical decision making (see chart for details).  86 year old w/ metastatic prostate cancer presenting with constipation x 4 days.  Started iron 2 days ago.  He is passing gas.  Overall has a fairly benign abdominal exam aside from some mild distension.  Hx of appendectomy but does not recall any other surgeries.  This may be medication induced constipation but I think a CT scan to evaluate for obstructing mass would be prudent.   If there is no evidence of this we  can try and enema here and initiate stool softeners or other laxatives at home.  He has very good doctor follow up with a variety of specialists over the next few days.  Final Clinical Impression(s) / ED Diagnoses Final diagnoses:  Constipation, unspecified constipation type    Rx / DC Orders ED Discharge Orders         Ordered    senna-docusate (SENOKOT-S) 8.6-50 MG tablet  Daily     Discontinue  Reprint  01/12/20 1101           Wyvonnia Dusky, MD 01/12/20 1819

## 2020-01-13 ENCOUNTER — Ambulatory Visit (HOSPITAL_COMMUNITY)
Admission: RE | Admit: 2020-01-13 | Discharge: 2020-01-13 | Disposition: A | Discharge status: Home / Self Care | Payer: Medicare Other | Source: Ambulatory Visit | Attending: Urology | Admitting: Urology

## 2020-01-13 ENCOUNTER — Other Ambulatory Visit: Payer: Self-pay

## 2020-01-13 ENCOUNTER — Encounter (HOSPITAL_COMMUNITY)
Admission: RE | Admit: 2020-01-13 | Discharge: 2020-01-13 | Disposition: A | Discharge status: Home / Self Care | Payer: Medicare Other | Source: Ambulatory Visit | Attending: Urology | Admitting: Urology

## 2020-01-13 ENCOUNTER — Encounter: Payer: Self-pay | Admitting: Urology

## 2020-01-13 DIAGNOSIS — C61 Malignant neoplasm of prostate: Secondary | ICD-10-CM | POA: Diagnosis present

## 2020-01-13 MED ORDER — TECHNETIUM TC 99M MEDRONATE IV KIT
20.0000 | PACK | Freq: Once | INTRAVENOUS | Status: AC | PRN
  Administered 2020-01-13: 19 via INTRAVENOUS

## 2020-01-23 DIAGNOSIS — R001 Bradycardia, unspecified: Secondary | ICD-10-CM | POA: Diagnosis not present

## 2020-01-28 ENCOUNTER — Other Ambulatory Visit: Payer: Self-pay | Admitting: Urology

## 2020-01-28 DIAGNOSIS — C61 Malignant neoplasm of prostate: Secondary | ICD-10-CM

## 2020-01-29 ENCOUNTER — Other Ambulatory Visit: Payer: Self-pay

## 2020-01-29 ENCOUNTER — Telehealth: Payer: Self-pay

## 2020-01-29 NOTE — Telephone Encounter (Signed)
Notified caregiver. She stated the [facility] had all ready called and scheduled an appt.

## 2020-01-30 NOTE — Telephone Encounter (Signed)
Austin Gallo, MD  Moonshine, Hope, LPN; Dorisann Frames, RN; Valentina Lucks, LPN Notify patient, caregiver that PSA is up to 39, also bone scan looks like some areas of cancer in the bone are getting a bit worse. I think it is worthwhile considering adding a medication to help the Lupron worked better. He has to go to the cancer center for that. I put an order in for that.      note added with MD information  Fredna Dow, RN

## 2020-02-02 ENCOUNTER — Emergency Department (HOSPITAL_COMMUNITY)
Admission: EM | Admit: 2020-02-02 | Discharge: 2020-02-03 | Disposition: A | Discharge status: Home / Self Care | Payer: Medicare Other | Attending: Emergency Medicine | Admitting: Emergency Medicine

## 2020-02-02 ENCOUNTER — Encounter (HOSPITAL_COMMUNITY): Payer: Self-pay | Admitting: Emergency Medicine

## 2020-02-02 ENCOUNTER — Other Ambulatory Visit: Payer: Self-pay

## 2020-02-02 ENCOUNTER — Emergency Department (HOSPITAL_COMMUNITY): Payer: Medicare Other

## 2020-02-02 DIAGNOSIS — I129 Hypertensive chronic kidney disease with stage 1 through stage 4 chronic kidney disease, or unspecified chronic kidney disease: Secondary | ICD-10-CM | POA: Diagnosis not present

## 2020-02-02 DIAGNOSIS — Z79899 Other long term (current) drug therapy: Secondary | ICD-10-CM | POA: Diagnosis not present

## 2020-02-02 DIAGNOSIS — N189 Chronic kidney disease, unspecified: Secondary | ICD-10-CM | POA: Insufficient documentation

## 2020-02-02 DIAGNOSIS — N3000 Acute cystitis without hematuria: Secondary | ICD-10-CM | POA: Diagnosis not present

## 2020-02-02 DIAGNOSIS — Z8546 Personal history of malignant neoplasm of prostate: Secondary | ICD-10-CM | POA: Insufficient documentation

## 2020-02-02 DIAGNOSIS — Z87891 Personal history of nicotine dependence: Secondary | ICD-10-CM | POA: Insufficient documentation

## 2020-02-02 DIAGNOSIS — R5383 Other fatigue: Secondary | ICD-10-CM | POA: Diagnosis not present

## 2020-02-02 DIAGNOSIS — R5382 Chronic fatigue, unspecified: Secondary | ICD-10-CM | POA: Insufficient documentation

## 2020-02-02 DIAGNOSIS — I10 Essential (primary) hypertension: Secondary | ICD-10-CM | POA: Diagnosis not present

## 2020-02-02 DIAGNOSIS — J90 Pleural effusion, not elsewhere classified: Secondary | ICD-10-CM | POA: Diagnosis not present

## 2020-02-02 LAB — COMPREHENSIVE METABOLIC PANEL
ALT: 26 U/L (ref 0–44)
AST: 48 U/L — ABNORMAL HIGH (ref 15–41)
Albumin: 3.5 g/dL (ref 3.5–5.0)
Alkaline Phosphatase: 245 U/L — ABNORMAL HIGH (ref 38–126)
Anion gap: 9 (ref 5–15)
BUN: 41 mg/dL — ABNORMAL HIGH (ref 8–23)
CO2: 21 mmol/L — ABNORMAL LOW (ref 22–32)
Calcium: 8.1 mg/dL — ABNORMAL LOW (ref 8.9–10.3)
Chloride: 107 mmol/L (ref 98–111)
Creatinine, Ser: 2.11 mg/dL — ABNORMAL HIGH (ref 0.61–1.24)
GFR calc Af Amer: 29 mL/min — ABNORMAL LOW (ref >60)
GFR calc non Af Amer: 25 mL/min — ABNORMAL LOW (ref >60)
Glucose, Bld: 123 mg/dL — ABNORMAL HIGH (ref 70–99)
Potassium: 5.3 mmol/L — ABNORMAL HIGH (ref 3.5–5.1)
Sodium: 137 mmol/L (ref 135–145)
Total Bilirubin: 0.3 mg/dL (ref 0.3–1.2)
Total Protein: 7.5 g/dL (ref 6.5–8.1)

## 2020-02-02 LAB — URINALYSIS, ROUTINE W REFLEX MICROSCOPIC
Bilirubin Urine: NEGATIVE
Glucose, UA: NEGATIVE mg/dL
Hgb urine dipstick: NEGATIVE
Ketones, ur: NEGATIVE mg/dL
Nitrite: NEGATIVE
Protein, ur: 30 mg/dL — AB
Specific Gravity, Urine: 1.016 (ref 1.005–1.030)
pH: 5 (ref 5.0–8.0)

## 2020-02-02 LAB — CBC WITH DIFFERENTIAL/PLATELET
Abs Immature Granulocytes: 0.06 10*3/uL (ref 0.00–0.07)
Basophils Absolute: 0 10*3/uL (ref 0.0–0.1)
Basophils Relative: 0 %
Eosinophils Absolute: 0.1 10*3/uL (ref 0.0–0.5)
Eosinophils Relative: 1 %
HCT: 32.2 % — ABNORMAL LOW (ref 39.0–52.0)
Hemoglobin: 10.3 g/dL — ABNORMAL LOW (ref 13.0–17.0)
Immature Granulocytes: 1 %
Lymphocytes Relative: 14 %
Lymphs Abs: 1.1 10*3/uL (ref 0.7–4.0)
MCH: 31 pg (ref 26.0–34.0)
MCHC: 32 g/dL (ref 30.0–36.0)
MCV: 97 fL (ref 80.0–100.0)
Monocytes Absolute: 0.9 10*3/uL (ref 0.1–1.0)
Monocytes Relative: 12 %
Neutro Abs: 5.3 10*3/uL (ref 1.7–7.7)
Neutrophils Relative %: 72 %
Platelets: 298 10*3/uL (ref 150–400)
RBC: 3.32 MIL/uL — ABNORMAL LOW (ref 4.22–5.81)
RDW: 14.1 % (ref 11.5–15.5)
WBC: 7.5 10*3/uL (ref 4.0–10.5)
nRBC: 0 % (ref 0.0–0.2)

## 2020-02-02 NOTE — ED Triage Notes (Signed)
Pt here due to increased fatigue since having pacemaker replaced on May 24th.

## 2020-02-02 NOTE — ED Provider Notes (Signed)
[facility] EMERGENCY DEPARTMENT Provider Note   CSN: [id number] Arrival date & time: 02/02/20  1942     History Chief Complaint  Patient presents with  . Fatigue    Austin Jacobs is a 86 y.o. male.  Patient presenting with increased fatigue since having pacemaker replaced on May 24.  I saw him at that time.  Sounds as if the weakness is gotten a little worse in the last few days.  Patient denies any chest pain.  Feels as if he may have some abdominal bloating.  But no abdominal pain.  No fevers.  No shortness of breath.        Past Medical History:  Diagnosis Date  . BPH (benign prostatic hyperplasia)   . Cancer [facility])    prostate cancer with metastasis to bone  . Chronic kidney disease   . Foley catheter in place   . Hypertension   . Presence of permanent cardiac pacemaker     Patient Active Problem List   Diagnosis Date Noted  . Bradycardia 12/15/2019  . Constipation 03/13/2019  . Normocytic anemia due to blood loss 10/12/2017  . Chest pain 05/09/2013  . HTN (hypertension) 05/09/2013  . Mobitz (type) I [facility]) atrioventricular block 05/09/2013    Past Surgical History:  Procedure Laterality Date  . APPENDECTOMY    . COLONOSCOPY    . ESOPHAGOGASTRODUODENOSCOPY N/A 10/24/2017   Procedure: ESOPHAGOGASTRODUODENOSCOPY (EGD);  Surgeon: Danie Binder, MD;  Location: [facility] ENDO SUITE;  Service: Endoscopy;  Laterality: N/A;  2:45pm  . HERNIA REPAIR    . PACEMAKER IMPLANT         Family History  Problem Relation Age of Onset  . Stroke Mother   . Lung cancer Father   . Colon cancer Neg Hx   . Colon polyps Neg Hx     Social History   Tobacco Use  . Smoking status: Former Smoker    Packs/day: 0.50    Years: 22.00    Pack years: 11.00    Types: Cigarettes, Pipe    Start date: 07/26/1935    Quit date: 07/25/1957    Years since quitting: 62.5  . Smokeless tobacco: Never Used  Vaping Use  . Vaping Use: Never used  Substance Use Topics  . Alcohol use:  Yes    Alcohol/week: 0.0 standard drinks    Comment: Occasionally- few times a month  . Drug use: No    Home Medications Prior to Admission medications   Medication Sig Start Date End Date Taking? Authorizing Provider  aliskiren (TEKTURNA) 150 MG tablet Take 150 mg by mouth daily.   Yes [provider]  leuprolide (LUPRON) 30 MG injection Inject into the muscle.   Yes [provider]  magnesium hydroxide (PHILLIPS MILK OF MAGNESIA) 311 MG CHEW chewable tablet 1 PO ONE TO THREE TIMES A DAY. Patient taking differently: Chew 311 mg by mouth 3 (three) times daily as needed.  03/13/19  Yes Fields, Marga Melnick, MD  Multiple Vitamins-Minerals (CENTRUM SILVER PO) Take 1 tablet by mouth daily.   Yes [provider]  pantoprazole (PROTONIX) 40 MG tablet Take 1 tablet (40 mg total) by mouth daily. 04/19/18  Yes Fields, Sandi L, MD  senna-docusate (SENOKOT-S) 8.6-50 MG tablet Take 1 tablet by mouth daily. 01/12/20 02/11/20 Yes Trifan, Carola Rhine, MD  cephALEXin (KEFLEX) 500 MG capsule Take 1 capsule (500 mg total) by mouth 4 (four) times daily. 02/03/20   Fredia Sorrow, MD  econazole nitrate 1 % cream  01/06/20   [provider]  erythromycin ophthalmic ointment 1 application See admin instructions. Applied inside the bottom lower eyelids as directed daily Patient not taking: Reported on 01/07/2020 12/01/17   [provider]  predniSONE (DELTASONE) 20 MG tablet Take 1 tablet (20 mg total) by mouth 2 (two) times daily. Patient not taking: Reported on 09/24/2019 06/21/19   Daleen Bo, MD  iron polysaccharides (NIFEREX) 150 MG capsule Take 1 capsule (150 mg total) by mouth daily. 03/13/19 01/12/20  Danie Binder, MD    Allergies    Patient has no known allergies.  Review of Systems   Review of Systems  Constitutional: Positive for fatigue. Negative for chills and fever.  HENT: Negative for congestion, rhinorrhea and sore throat.   Eyes: Negative for visual  disturbance.  Respiratory: Negative for cough and shortness of breath.   Cardiovascular: Negative for chest pain and leg swelling.  Gastrointestinal: Positive for abdominal distention. Negative for abdominal pain, diarrhea, nausea and vomiting.  Genitourinary: Negative for dysuria.  Musculoskeletal: Negative for back pain and neck pain.  Skin: Negative for rash.  Neurological: Positive for weakness. Negative for dizziness, light-headedness and headaches.  Hematological: Does not bruise/bleed easily.  Psychiatric/Behavioral: Negative for confusion.    Physical Exam Updated Vital Signs BP (!) 107/51   Pulse 89   Temp 98.5 F (36.9 C) (Oral)   Resp (!) 21   Ht 1.575 m (5\' 2" )   Wt 63.5 kg   SpO2 100%   BMI 25.61 kg/m   Physical Exam Vitals and nursing note reviewed.  Constitutional:      Appearance: Normal appearance. He is well-developed.  HENT:     Head: Normocephalic and atraumatic.  Eyes:     Extraocular Movements: Extraocular movements intact.     Conjunctiva/sclera: Conjunctivae normal.     Pupils: Pupils are equal, round, and reactive to light.  Cardiovascular:     Rate and Rhythm: Normal rate and regular rhythm.     Heart sounds: No murmur heard.   Pulmonary:     Effort: Pulmonary effort is normal. No respiratory distress.     Breath sounds: Normal breath sounds.  Abdominal:     General: There is no distension.     Palpations: Abdomen is soft.     Tenderness: There is no abdominal tenderness. There is no guarding.  Musculoskeletal:        General: No swelling.     Cervical back: Normal range of motion and neck supple.  Skin:    General: Skin is warm and dry.     Capillary Refill: Capillary refill takes less than 2 seconds.  Neurological:     General: No focal deficit present.     Mental Status: He is alert and oriented to person, place, and time.     Cranial Nerves: No cranial nerve deficit.     Sensory: No sensory deficit.     Motor: No weakness.      ED Results / Procedures / Treatments   Labs (all labs ordered are listed, but only abnormal results are displayed) Labs Reviewed  CBC WITH DIFFERENTIAL/PLATELET - Abnormal; Notable for the following components:      Result Value   RBC 3.32 (*)    Hemoglobin 10.3 (*)    HCT 32.2 (*)    All other components within normal limits  URINALYSIS, ROUTINE W REFLEX MICROSCOPIC - Abnormal; Notable for the following components:   APPearance CLOUDY (*)    Protein, ur 30 (*)  Leukocytes,Ua MODERATE (*)    Bacteria, UA FEW (*)    All other components within normal limits  COMPREHENSIVE METABOLIC PANEL - Abnormal; Notable for the following components:   Potassium 5.3 (*)    CO2 21 (*)    Glucose, Bld 123 (*)    BUN 41 (*)    Creatinine, Ser 2.11 (*)    Calcium 8.1 (*)    AST 48 (*)    Alkaline Phosphatase 245 (*)    GFR calc non Af Amer 25 (*)    GFR calc Af Amer 29 (*)    All other components within normal limits  URINE CULTURE    EKG EKG Interpretation  Date/Time:  Sunday February 02 2020 20:11:32 EDT Ventricular Rate:  71 PR Interval:    QRS Duration: 125 QT Interval:  423 QTC Calculation: 460 R Axis:   -16 Text Interpretation: AV PACED RHYTHM Left bundle branch block Confirmed by Ladajah Soltys ([id number]) on 02/02/2020 9:14:00 PM   Radiology DG Chest Port 1 View  Result Date: 02/02/2020 CLINICAL DATA:  Increasing fatigue after pacemaker replacement 12/16/2019 EXAM: PORTABLE CHEST 1 VIEW COMPARISON:  12/15/2019 FINDINGS: Single frontal view of the chest demonstrates pacemaker generator overlying left chest, proximal lead overlies right atrium and distal lead overlies right ventricle. Cardiac silhouette is enlarged but stable. Chronic interstitial scarring without airspace disease, effusion, or pneumothorax. No acute bony abnormalities. IMPRESSION: 1. No acute intrathoracic process. Electronically Signed   By: Michael  Brown M.D.   On: 02/02/2020 21:07     Procedures Procedures (including critical care time)  Medications Ordered in ED Medications  cefTRIAXone (ROCEPHIN) 1 g in sodium chloride 0.9 % 100 mL IVPB (has no administration in time range)    ED Course  I have reviewed the triage vital signs and the nursing notes.  Pertinent labs & imaging results that were available during my care of the patient were reviewed by me and considered in my medical decision making (see chart for details).    MDM Rules/Calculators/A&P                          Work-up in the emergency department no arrhythmias.  Patient seems to have a paced rhythm consistent with his pacemaker.  Urinalysis raises some concerns for urinary tract infection will start antibiotics urine sent for culture.  Electrolytes without significant change from baseline for him.  Patient given a dose of ceftriaxone here.  Be continued on Keflex at home.  Follow-up with his doctors.  Return for any new or worse symptoms. May be the explanation for his fatigue.  Patient will return if not improving over the next couple days.   Final Clinical Impression(s) / ED Diagnoses Final diagnoses:  Fatigue, unspecified type  Acute cystitis without hematuria    Rx / DC Orders ED Discharge Orders         Ordered    cephALEXin (KEFLEX) 500 MG capsule  4 times daily     Discontinue  Reprint     07 /12/21 0018           Fredia Sorrow, MD 02/06/20 1909

## 2020-02-03 DIAGNOSIS — R5382 Chronic fatigue, unspecified: Secondary | ICD-10-CM | POA: Diagnosis not present

## 2020-02-03 MED ORDER — SODIUM CHLORIDE 0.9 % IV SOLN
1.0000 g | Freq: Once | INTRAVENOUS | Status: AC
  Administered 2020-02-03: 1 g via INTRAVENOUS
  Filled 2020-02-03: qty 10

## 2020-02-03 MED ORDER — CEPHALEXIN 500 MG PO CAPS
500.0000 mg | ORAL_CAPSULE | Freq: Four times a day (QID) | ORAL | 0 refills | Status: DC
Start: 2020-02-03 — End: 2020-02-13

## 2020-02-03 NOTE — Discharge Instructions (Addendum)
Work-up here today shows evidence of urinary tract infection.  The rest of the work-up is negative. You received a gram of Rocephin here tonight.  Start taking the Keflex antibiotic tomorrow.  Return for any new or worse symptoms.  Would expect you to be improved and 48 hours.  If not return.

## 2020-02-04 DIAGNOSIS — Z950 Presence of cardiac pacemaker: Secondary | ICD-10-CM | POA: Diagnosis not present

## 2020-02-04 DIAGNOSIS — R5383 Other fatigue: Secondary | ICD-10-CM | POA: Diagnosis not present

## 2020-02-04 DIAGNOSIS — I10 Essential (primary) hypertension: Secondary | ICD-10-CM | POA: Diagnosis not present

## 2020-02-04 DIAGNOSIS — N390 Urinary tract infection, site not specified: Secondary | ICD-10-CM | POA: Diagnosis not present

## 2020-02-04 DIAGNOSIS — Z299 Encounter for prophylactic measures, unspecified: Secondary | ICD-10-CM | POA: Diagnosis not present

## 2020-02-04 LAB — URINE CULTURE

## 2020-02-06 ENCOUNTER — Other Ambulatory Visit: Payer: Self-pay

## 2020-02-06 ENCOUNTER — Inpatient Hospital Stay (HOSPITAL_COMMUNITY): Payer: Medicare Other | Attending: Hematology | Admitting: Hematology

## 2020-02-06 ENCOUNTER — Encounter (HOSPITAL_COMMUNITY): Payer: Self-pay | Admitting: Hematology

## 2020-02-06 VITALS — BP 146/62 | HR 90 | Temp 96.9°F | Resp 16 | Ht 63.0 in | Wt 140.8 lb

## 2020-02-06 DIAGNOSIS — Z192 Hormone resistant malignancy status: Secondary | ICD-10-CM | POA: Diagnosis not present

## 2020-02-06 DIAGNOSIS — D649 Anemia, unspecified: Secondary | ICD-10-CM | POA: Insufficient documentation

## 2020-02-06 DIAGNOSIS — C61 Malignant neoplasm of prostate: Secondary | ICD-10-CM | POA: Diagnosis not present

## 2020-02-06 DIAGNOSIS — Z87891 Personal history of nicotine dependence: Secondary | ICD-10-CM | POA: Insufficient documentation

## 2020-02-06 DIAGNOSIS — R911 Solitary pulmonary nodule: Secondary | ICD-10-CM | POA: Insufficient documentation

## 2020-02-06 DIAGNOSIS — N200 Calculus of kidney: Secondary | ICD-10-CM | POA: Insufficient documentation

## 2020-02-06 DIAGNOSIS — R140 Abdominal distension (gaseous): Secondary | ICD-10-CM | POA: Diagnosis not present

## 2020-02-06 DIAGNOSIS — N189 Chronic kidney disease, unspecified: Secondary | ICD-10-CM | POA: Insufficient documentation

## 2020-02-06 DIAGNOSIS — I129 Hypertensive chronic kidney disease with stage 1 through stage 4 chronic kidney disease, or unspecified chronic kidney disease: Secondary | ICD-10-CM | POA: Diagnosis not present

## 2020-02-06 DIAGNOSIS — Z950 Presence of cardiac pacemaker: Secondary | ICD-10-CM | POA: Diagnosis not present

## 2020-02-06 DIAGNOSIS — C7951 Secondary malignant neoplasm of bone: Secondary | ICD-10-CM | POA: Diagnosis not present

## 2020-02-06 DIAGNOSIS — K5900 Constipation, unspecified: Secondary | ICD-10-CM | POA: Insufficient documentation

## 2020-02-06 DIAGNOSIS — N281 Cyst of kidney, acquired: Secondary | ICD-10-CM | POA: Insufficient documentation

## 2020-02-06 DIAGNOSIS — N400 Enlarged prostate without lower urinary tract symptoms: Secondary | ICD-10-CM | POA: Insufficient documentation

## 2020-02-06 DIAGNOSIS — Z79899 Other long term (current) drug therapy: Secondary | ICD-10-CM | POA: Insufficient documentation

## 2020-02-06 DIAGNOSIS — I2510 Atherosclerotic heart disease of native coronary artery without angina pectoris: Secondary | ICD-10-CM | POA: Insufficient documentation

## 2020-02-06 MED ORDER — PREDNISONE 5 MG PO TABS
5.0000 mg | ORAL_TABLET | Freq: Every day | ORAL | 6 refills | Status: DC
Start: 2020-02-06 — End: 2020-02-17

## 2020-02-06 MED ORDER — ABIRATERONE ACETATE 250 MG PO TABS: 500.0000 mg | ORAL_TABLET | Freq: Every day | ORAL | 0 refills | Status: DC

## 2020-02-06 NOTE — Progress Notes (Signed)
[place] [address] [place] [address]   CLINIC:  Medical Oncology/Hematology  CONSULT NOTE  Patient Care Team: Monico Blitz, MD as PCP - General (Internal Medicine) Danie Binder, MD (Inactive) as Consulting Physician (Gastroenterology)  CHIEF COMPLAINTS/PURPOSE OF CONSULTATION:  Evaluation of metastatic prostate cancer  HISTORY OF PRESENTING ILLNESS:  Mr. Austin Jacobs 86 y.o. male is here because of evaluation of metastatic prostate cancer, at the request of Dr. Franchot Gallo at [facility].  He was diagnosed with metastatic prostate cancer on 04/09/2019, biopsy showing 5+4= 9, PSA 21.  He was started on Firmagon and was transitioned to Lupron.  PSA improved to 10.8 on 06/27/2019, 8.1 on 09/24/2019, increased to 45.9 on 01/07/2020.  His last bone scan on 01/13/2020 showed mild progression of metastases in the spine, ribs and pelvis.  Today he is accompanied with his son. He reports feeling well. He still has the catheter; he has to empty it twice a night. He denies having any strokes or MI.   He quit smoking about 62 years ago and used to smoke 1 PPD for 11 years. He has a caretaker who lives with him. He is able to do his chores and he walks as much as he can. He had a brother killed in 77 War II during [place]. His father had lung cancer after smoking a long time. He received his second COVID vaccine in 09/2019. He ambulates with a cane, but he does not drive, except to drive out into the country.   MEDICAL HISTORY:  Past Medical History:  Diagnosis Date   BPH (benign prostatic hyperplasia)    Cancer (HCC)    prostate cancer with metastasis to bone   Chronic kidney disease    Foley catheter in place    Hypertension    Presence of permanent cardiac pacemaker    Prostate cancer [facility])     SURGICAL HISTORY: Past Surgical History:  Procedure Laterality Date   APPENDECTOMY     COLONOSCOPY     ESOPHAGOGASTRODUODENOSCOPY N/A  10/24/2017   Procedure: ESOPHAGOGASTRODUODENOSCOPY (EGD);  Surgeon: Danie Binder, MD;  Location: [facility];  Service: Endoscopy;  Laterality: N/A;  2:45pm   HERNIA REPAIR     PACEMAKER IMPLANT      SOCIAL HISTORY: Social History   Socioeconomic History   Marital status: Widowed    Spouse name: Not on file   Number of children: 1   Years of education: Not on file   Highest education level: Not on file  Occupational History   Not on file  Tobacco Use   Smoking status: Former Smoker    Packs/day: 0.50    Years: 22.00    Pack years: 11.00    Types: Cigarettes, Pipe    Start date: 07/26/1935    Quit date: 07/25/1957    Years since quitting: 62.5   Smokeless tobacco: Never Used  Vaping Use   Vaping Use: Never used  Substance and Sexual Activity   Alcohol use: Yes    Alcohol/week: 0.0 standard drinks    Comment: Occasionally- few times a month   Drug use: No   Sexual activity: Not on file  Other Topics Concern   Not on file  Social History Narrative   HAS INSIGNIFICANT OTHER SINCE 2006. HAS 8 NUMBER ONES.   RETIRED: WORKED FOR EM[employer]YE[employer] FOR THE [employer].   BORN AND RAISED IN [place] AND LEFT SINCE 1939. WORKED [place], [place]  FOR 39 YRS.    BROTHER KILLED IN [place].   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   Social Determinants of Health   Financial Resource Strain: Low Risk    Difficulty of Paying Living Expenses: Not hard at all  Food Insecurity: No Food Insecurity   Worried About Charity fundraiser in the Last Year: Never true   [place] in the Last Year: Never true  Transportation Needs: No Transportation Needs   Lack of Transportation (Medical): No   Lack of Transportation (Non-Medical): No  Physical Activity: Insufficiently Active   Days of Exercise per Week: 1 day   Minutes of Exercise per Session: 20 min  Stress: No Stress Concern Present   Feeling of Stress : Not at all  Social Connections: Moderately Integrated    Frequency of Communication with Friends and Family: More than three times a week   Frequency of Social Gatherings with Friends and Family: More than three times a week   Attends Religious Services: More than 4 times per year   Active Member of Clubs or Organizations: No   Attends Music therapist: More than 4 times per year   Marital Status: Widowed  Human resources officer Violence: Not At Risk   Fear of Current or Ex-Partner: No   Emotionally Abused: No   Physically Abused: No   Sexually Abused: No    FAMILY HISTORY: Family History  Problem Relation Age of Onset   Stroke Mother    Lung cancer Father    Colon cancer Neg Hx    Colon polyps Neg Hx     ALLERGIES:  has No Known Allergies.  MEDICATIONS:  Current Outpatient Medications  Medication Sig Dispense Refill   aliskiren (TEKTURNA) 150 MG tablet Take 150 mg by mouth daily.     cephALEXin (KEFLEX) 500 MG capsule Take 1 capsule (500 mg total) by mouth 4 (four) times daily. 28 capsule 0   econazole nitrate 1 % cream      leuprolide (LUPRON) 30 MG injection Inject into the muscle.     magnesium hydroxide (PHILLIPS MILK OF MAGNESIA) 311 MG CHEW chewable tablet 1 PO ONE TO THREE TIMES A DAY. (Patient taking differently: Chew 311 mg by mouth 3 (three) times daily as needed. ) 62 tablet 11   Multiple Vitamins-Minerals (CENTRUM SILVER PO) Take 1 tablet by mouth daily.     polyethylene glycol powder (GLYCOLAX/MIRALAX) 17 GM/SCOOP powder Take 17 g by mouth daily.     senna-docusate (SENOKOT-S) 8.6-50 MG tablet Take 1 tablet by mouth daily. 30 tablet 0   pantoprazole (PROTONIX) 40 MG tablet Take 1 tablet (40 mg total) by mouth daily. (Patient not taking: Reported on 02/06/2020) 90 tablet 3   No current facility-administered medications for this visit.    REVIEW OF SYSTEMS:   Review of Systems  Constitutional: Positive for appetite change (mildly decreased) and fatigue (mild).  Musculoskeletal:  Positive for back pain (4/10 mid back pain).  All other systems reviewed and are negative.    PHYSICAL EXAMINATION: ECOG PERFORMANCE STATUS: 2 - Symptomatic, <50% confined to bed  Vitals:   02/06/20 1325  BP: (!) 146/62  Pulse: 90  Resp: 16  Temp: (!) 96.9 F (36.1 C)  SpO2: 100%   Filed Weights   02/06/20 1325  Weight: 140 lb 12.8 oz (63.9 kg)   Physical Exam Vitals reviewed.  Constitutional:      Appearance: Normal appearance.  Cardiovascular:     Rate and Rhythm: Normal  rate and regular rhythm.     Pulses: Normal pulses.     Heart sounds: Murmur (systolic ejection murmur) heard.   Pulmonary:     Effort: Pulmonary effort is normal.     Breath sounds: Normal breath sounds.  Abdominal:     Palpations: Abdomen is soft. There is no mass.     Tenderness: There is no abdominal tenderness.  Musculoskeletal:     Right lower leg: No edema.     Left lower leg: No edema.  Neurological:     General: No focal deficit present.     Mental Status: He is alert and oriented to person, place, and time.  Psychiatric:        Mood and Affect: Mood normal.        Behavior: Behavior normal.      LABORATORY DATA:  I have reviewed the data as listed CBC Latest Ref Rng & Units 02/02/2020 12/15/2019 08/20/2019  WBC 4.0 - 10.5 K/uL 7.5 6.1 7.2  Hemoglobin 13.0 - 17.0 g/dL 10.3(L) 11.0(L) 11.4(L)  Hematocrit 39 - 52 % 32.2(L) 34.5(L) 36.3(L)  Platelets 150 - 400 K/uL 298 220 335   CMP Latest Ref Rng & Units 02/02/2020 12/15/2019 03/21/2019  Glucose 70 - 99 mg/dL 123(H) 159(H) 117(H)  BUN 8 - 23 mg/dL 41(H) 44(H) 46(H)  Creatinine 0.61 - 1.24 mg/dL 2.11(H) 2.39(H) 2.42(H)  Sodium 135 - 145 mmol/L 137 141 137  Potassium 3.5 - 5.1 mmol/L 5.3(H) 5.1 4.8  Chloride 98 - 111 mmol/L 107 112(H) 102  CO2 22 - 32 mmol/L 21(L) 20(L) 24  Calcium 8.9 - 10.3 mg/dL 8.1(L) 8.5(L) 9.5  Total Protein 6.5 - 8.1 g/dL 7.5 - -  Total Bilirubin 0.3 - 1.2 mg/dL 0.3 - -  Alkaline Phos 38 - 126 U/L 245(H) - -   AST 15 - 41 U/L 48(H) - -  ALT 0 - 44 U/L 26 - -   Lab Results  Component Value Date   PSA 45.9 (H) 01/07/2020   PSA 8.1 (H) 09/24/2019    RADIOGRAPHIC STUDIES: I have personally reviewed the radiological images as listed and agreed with the findings in the report. CT ABDOMEN PELVIS WO CONTRAST  Result Date: 01/12/2020 CLINICAL DATA:  Abdominal distension and constipation. Possible obstructing abdominal mass. Chronic kidney disease. EXAM: CT ABDOMEN AND PELVIS WITHOUT CONTRAST TECHNIQUE: Multidetector CT imaging of the abdomen and pelvis was performed following the standard protocol without IV contrast. COMPARISON:  04/25/2019 and Feb 03, 202017 FINDINGS: Lower chest: Calcified granuloma over the left lower lobe and right middle lobe. Noncalcified subpleural 3 mm nodule over the posterior right lower lobe unchanged. Mild subpleural interstitial thickening over the lung bases unchanged. Calcified plaque over the descending thoracic aorta. Calcified plaque over the left lateral circumflex coronary artery. Cardiac pacer leads are present. Hepatobiliary: Liver, gallbladder and biliary tree are normal. Pancreas: Normal. Spleen: Normal. Adrenals/Urinary Tract: Adrenal glands are normal. Kidneys at the lower limits of normal in size without hydronephrosis. 3 mm stone over the upper pole right kidney unchanged. Bilateral renal cysts with the larger measuring 4.6 cm over the left mid to lower pararenal region unchanged. Ureters are normal. Foley catheter is present within the bladder. Stomach/Bowel: Stomach and small bowel are normal. Previous appendectomy. Colon is normal with mild fecal retention. Vascular/Lymphatic: Moderate calcified plaque over the abdominal aorta which is normal in caliber. Significant calcified plaque at the origin/proximal right renal artery. No adenopathy. Reproductive: Normal. Other: No free fluid or focal inflammatory change. Musculoskeletal: Numerous  sclerotic foci throughout the bony  structures with interval progression compatible patient's known metastatic prostate cancer. IMPRESSION: 1. No acute findings in the abdomen/pelvis. No evidence of bowel obstruction. 2. Bilateral renal cysts unchanged. Nonobstructing 3 mm right renal stone unchanged. 3. Aortic Atherosclerosis (ICD10-I70.0). Atherosclerotic coronary artery disease. 4. Diffuse sclerotic foci throughout the bony structures with interval progression compatible with known metastatic prostate cancer. Electronically Signed   By: Marin Olp M.D.   On: 01/12/2020 09:32   NM Bone Scan Whole Body  Result Date: 01/14/2020 CLINICAL DATA:  Prostate cancer staging EXAM: NUCLEAR MEDICINE WHOLE BODY BONE SCAN TECHNIQUE: Whole body anterior and posterior images were obtained approximately 3 hours after intravenous injection of radiopharmaceutical. RADIOPHARMACEUTICALS:  Nineteen mCi Technetium-8m MDP IV COMPARISON:  Bone scan 04/25/2019 FINDINGS: Again demonstrated focal uptake within the midthoracic vertebral bodies. Several lesions in the T12 and L1 vertebral bodies are slightly more prominent. There are bilateral rib which are more prominent. One posterior LEFT ninth rib lesion is elongated. Lesion over the RIGHT iliac crest is also more prominent. Increased uptake in the RIGHT scapula additionally. IMPRESSION: Mild progression of multifocal skeletal metastasis involving the spine, ribs and pelvis. Electronically Signed   By: Suzy Bouchard M.D.   On: 01/14/2020 08:39   DG Chest Port 1 View  Result Date: 02/02/2020 CLINICAL DATA:  Increasing fatigue after pacemaker replacement 12/16/2019 EXAM: PORTABLE CHEST 1 VIEW COMPARISON:  12/15/2019 FINDINGS: Single frontal view of the chest demonstrates pacemaker generator overlying left chest, proximal lead overlies right atrium and distal lead overlies right ventricle. Cardiac silhouette is enlarged but stable. Chronic interstitial scarring without airspace disease, effusion, or pneumothorax.  No acute bony abnormalities. IMPRESSION: 1. No acute intrathoracic process. Electronically Signed   By: Randa Ngo M.D.   On: 02/02/2020 21:07    ASSESSMENT:  1.  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  CT scan on the same day did not show any nodal metastasis. Mills Koller started on 05/14/2019.  Transition to Lupron.  Last Lupron 45 mg on 01/07/2020. -PSA 10.8 (06/27/2019), 8.1 (09/24/2019), 45.9 (01/07/2020). -Bone scan on 01/13/2020 shows mild progression of multifocal skeletal metastasis involving the spine, ribs and pelvis.  2.  Bone metastasis: -Denosumab started on 07/24/2019, last dose on 01/07/2020.  3.  CKD: -Baseline between 2.1-2.3.  PLAN:  1.  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 -Despite his age, he has good performance status. -I have recommended Abiraterone with prednisone.  We talked about the side effects in detail. -I will start him on abiraterone 250 mg daily with prednisone 5 mg daily.  I plan to increase abiraterone to 500 mg daily. -He will continue Lupron with Dr. Diona Fanti. -I will see him back in 2 weeks after the start of Abiraterone with repeat labs.  2.  Bone metastasis: -Continue denosumab monthly. -Continue calcium and vitamin D.  3.  Hypertension: -Continue Tekturna 150 mg daily.  4.  Normocytic anemia: -Hemoglobin was 10.3 with MCV 97. -Patient has CKD contributing to anemia. -We will check for ferritin, iron panel, K24 and folic acid at next visit.   All questions were answered. The patient knows to call the clinic with any problems, questions or concerns.  Derek Jack, MD, 02/06/20 2:27 PM  Deneise Lever  [place] [phone]   I, Milinda Antis, am acting as a scribe for Dr. Sanda Linger.  I, Derek Jack MD, have reviewed the above documentation for accuracy and completeness, and I agree with the above.

## 2020-02-06 NOTE — Patient Instructions (Signed)
[place] at [facility] Discharge Instructions  You were seen today by Dr. Delton Coombes. He went over your recent results and scans. The possible treatments, including Xtandi and Zytiga, and side effects, including falls, increase in blood pressure, and drop in potassium, were discussed. Dr. Delton Coombes will see you back in 3 weeks, 2 weeks after starting your new medication, for labs and follow up.   Thank you for choosing [place] at [facility] to provide your oncology and hematology care.  To afford each patient quality time with our provider, please arrive at least 15 minutes before your scheduled appointment time.   If you have a lab appointment with the [facility] please come in thru the Main Entrance and check in at the main information desk  You need to re-schedule your appointment should you arrive 10 or more minutes late.  We strive to give you quality time with our providers, and arriving late affects you and other patients whose appointments are after yours.  Also, if you no show three or more times for appointments you may be dismissed from the clinic at the providers discretion.     Again, thank you for choosing [facility].  Our hope is that these requests will decrease the amount of time that you wait before being seen by our physicians.       _____________________________________________________________  Should you have questions after your visit to [facility], please contact our office at [phone] between the hours of 8:00 a.m. and 4:30 p.m.  Voicemails left after 4:00 p.m. will not be returned until the following business day.  For prescription refill requests, have your pharmacy contact our office and allow 72 hours.    Cancer Center Support Programs:   > Cancer Support Group  2nd Tuesday of the month 1pm-2pm, Journey Room

## 2020-02-07 ENCOUNTER — Ambulatory Visit (INDEPENDENT_AMBULATORY_CARE_PROVIDER_SITE_OTHER): Payer: Medicare Other

## 2020-02-07 DIAGNOSIS — C61 Malignant neoplasm of prostate: Secondary | ICD-10-CM

## 2020-02-07 MED ORDER — DENOSUMAB 120 MG/1.7ML ~~LOC~~ SOLN
120.0000 mg | Freq: Once | SUBCUTANEOUS | Status: AC
  Administered 2020-02-07: 120 mg via SUBCUTANEOUS

## 2020-02-07 MED FILL — predniSONE 5 MG TABS: 5 | 30 days supply | Qty: 30 | Fill #0

## 2020-02-07 NOTE — Progress Notes (Signed)
Cath Change/ Replacement  Patient is present today for a catheter change due to urinary retention.  42ml of water was removed from the balloon, a 16FR foley cath was removed with out difficulty.  Patient was cleaned and prepped in a sterile fashion with betadine. A 16 FR foley cath was replaced into the bladder no complications were noted Urine return was noted 1ml and urine was yellow in color. The balloon was filled with 21ml of sterile water. A leg bag was attached for drainage.  A night bag was also given to the patient and patient was given instruction on how to change from one bag to another. Patient was given proper instruction on catheter care.    Performed by: Zlata Alcaide, LPN  Follow up: I month cath change/ xgeva

## 2020-02-07 NOTE — Patient Instructions (Addendum)
Foley Catheter Care and Patient Education  Perform catheter care every day.  You can do this while in the shower, but NOT while taking a tub bath.  You will need the following supplies: -mild soap, such as Dove -water -a clean washcloth (not one already used for bathing) or a 4x4 piece of gauze -1 Cath-Secure -night drainage bag -2 alcohol swabs  1. Was you hands thoroughly with soap and water 2. Using mild soap and water, clan your genital area a. Men should retract the foreskin, if needed, and clean the area, including the penis b. Women should separate the labia, and clean the area from front to back  3. Clean your urinary opening, which is where the catheter enters your body. 4. Clean the catheter from where it enters your body and then down, away from your body.  Hold the catheter at the point it enters your body so that you don't put tension on it. 5. Rinse the area well and dry it gently.  Changing the drainage bag You will change your drainage bag twice a day -in the morning after you shower, change he night bag to the leg bag -at night before you go to bed, change the leg bag to the night bag  1. Wash your hands thoroughly with soap and water 2. Empty the urine from the drainage bag into the toilet before you change it 3. Pinch off the catheter with your fingers and disconnect the used bag 4. Wipe the end of the catheter using an alcohol pad 5. Wipe the connector on the bag using the second alcohol pad 6. Connect the clean bag to the catheter and release your finger pinch 7. Check all connections.  Straighten any kinks or twits in the tubing  Caring for the Leg bag -always wear the leg bag below your knee.  This will help it drain. -keep the leg bag secure with the velcro straps.  If the straps leave a mark on your leg they are to tight and should be loosened.  Leaving the straps too tight can decrease you circulation and lead to blood clots. -empty the leg bag through  the spout at the bottom every 2-4 hours as needed.  Do not let the bag become completely full. -do not lie down for longer than 2 hours while you are wearing the leg bag.  Caring for the Night Bag -always keep the night bag below the level of your bladder . -to hang your night bag while you sleep, place a clean plastic bag inside of a wastebasket.  Hang the night bag on the inside of the wastebasket.  Cleaning the Drainage bag 1. Wash you hands thoroughly with soap and water. 2. Rinse the equipment with cool water.  Do not use hot water it can damage the plastic equipment. 3. Was the equipment with a mild liquid detergent (ivory) and rinse with cool water. 4. To decrease odor, fill the bag halfway with a mixture of 1 part vinegar and 3 parts water. Shake the bag and let it sit for 15 minutes. 5. Rinse the bag with cool water and hang it up to dry.  Preventing infection -keep the drainage bag below the level of your bladder and off the floor at all times. -keep the catheter secured to your thigh to prevent it from moving. -do not lie on or block the flow of urine in the tubing. -shower daily to keep the catheter clean. -clean your hands before and after touching   the catheter or bag. -the spout of the drainage bag should never touch the side of the toilet or any emptying container.  Special Points -You may see some blood or urine around where the catheter enters your body, especially when walking or having a bowel movement.  This is normal, as long as there is urine draining into the drainage bag.  If you experience significant leakage around  catheter tube where it enters your urethra possibly associated with lower abdominal cramping you could be having a bladder spasm.  Please notify your doctor and we can prescribe you a medication to improve your symptoms. -drink 1-2 glasses of liquids every 2 hours while you're awake.  Call your doctor immediately if -your catheter comes out, do not try  to replace it yourself -you have temperature of 101F (38.8C) or higher -you have decrease in the amount of urine you are making -you have foul-smelling urine -you have bright red blood or large blood clots in your urine -you have abdominal pain and no urine in your catheter bag     Patient Information Sheet : (Denosumab) Austin Jacobs)  What is this drug? Denosumab is a drug that maintains the strength of bones. It is FDA- approved treatment for patients with documented bone metastasis from solid tumors such as prostate cancer under the name of xgeva. It is also an FDA-approved treatment for men with prostate cancer at risk for complications related to bone loss under the name Prolia. Denosumab is not chemotherapy but is used with other treatments for advanced prostate cancer. It is administered by a subcutaneous injection.  Why am I taking this drug? You have been diagnosed with prostate cancer and are felt to be at an increased risk for bone loss related to your treatment or you have advanced prostate cancer that has spread to the bone increasing your risk for bone fractures. It is administered either every 6 months or every month depending on your risk factors for bone fracture.  How can this drug benefit me? Denosumab can reduce the risk of bone fractures and delay complications that occur when cancer has spread to the bones.  What should I do while taking Xgeva or Prolia? You need to have regular dental exams. You need to take calcium 1200mg  each day as well as vitamin D 1000 IU per day. There are no restrictions on food, beverages or activity while receiving this medication.  What are the possible side effects of this medication? The most common side effects are fatigue, weakness, nausea and lowering of calcium levels. The most serious reaction os this drug is a serious allergic reaction which is rare. Other serious reactions that would require stopping the medication would be  abnormally low blood calcium levels or osteonecrosis of the jaw.  Osteonecrosis of the jaw is a rare condition that involves the loss or breakdown of the jaw bow. Signs and symptoms include pain, swelling or infections of the gums, loosening of the tooth, poor healing of gums, numbness or a feeling of heaviness in the jaw. You must notify your dentist and your urologist if you have any of these symptoms. Notify your urologist if you need any invasive dental treatments. Notify your dentist prior to starting this treatment. You must provide written clearance from your dentist prior to starting this drug and maintain regular dentist appointments throughout your treatment.   What is involved in receiving this drug? It is a subcutaneous injection (given under the skin) You will have a complete blood  chemistry panel drawn prior to starting this drug and repeated every 6 months or as ordered by the doctor. This will check your calcium, magnesium and phosphorus levels.

## 2020-02-10 ENCOUNTER — Other Ambulatory Visit: Payer: Self-pay

## 2020-02-10 NOTE — Progress Notes (Signed)
Cath Change/ Replacement  Patient is present today for a catheter change due to urinary retention.  13ml of water was removed from the balloon, a 16FR foley cath was removed with out difficulty.  Patient was cleaned and prepped in a sterile fashion with betadine. A 16 FR foley cath was replaced into the bladder no complications were noted Urine return was noted 53ml and urine was yellow in color. The balloon was filled with 56ml of sterile water. A leg bag was attached for drainage.  A night bag was also given to the patient and patient was given instruction on how to change from one bag to another. Patient was given proper instruction on catheter care.    Performed by: Kamori Kitchens, LPN  Follow up: I month cath change/ xgeva

## 2020-02-11 DIAGNOSIS — R21 Rash and other nonspecific skin eruption: Secondary | ICD-10-CM | POA: Diagnosis not present

## 2020-02-11 DIAGNOSIS — Z6825 Body mass index (BMI) 25.0-25.9, adult: Secondary | ICD-10-CM | POA: Diagnosis not present

## 2020-02-11 DIAGNOSIS — Z299 Encounter for prophylactic measures, unspecified: Secondary | ICD-10-CM | POA: Diagnosis not present

## 2020-02-11 DIAGNOSIS — I10 Essential (primary) hypertension: Secondary | ICD-10-CM | POA: Diagnosis not present

## 2020-02-11 DIAGNOSIS — F0390 Unspecified dementia without behavioral disturbance: Secondary | ICD-10-CM | POA: Diagnosis not present

## 2020-02-12 ENCOUNTER — Other Ambulatory Visit (HOSPITAL_COMMUNITY): Payer: Self-pay | Admitting: *Deleted

## 2020-02-12 ENCOUNTER — Telehealth (HOSPITAL_COMMUNITY): Payer: Self-pay | Admitting: Pharmacy Technician

## 2020-02-12 DIAGNOSIS — C61 Malignant neoplasm of prostate: Secondary | ICD-10-CM

## 2020-02-12 MED ORDER — ABIRATERONE ACETATE 250 MG PO TABS: 500.0000 mg | ORAL_TABLET | Freq: Every day | ORAL | 0 refills | Status: DC

## 2020-02-12 NOTE — Telephone Encounter (Signed)
Oral Oncology Patient Advocate Encounter  Received notification from [facility]) that prior authorization for Zytiga (Abiraterone) is required.  PA submitted on CoverMyMeds Key [id number] Status is pending  Oral Oncology Clinic will continue to follow.  [place] Patient [place] Phone [phone] Fax [phone] 02/12/2020 12:20 PM

## 2020-02-12 NOTE — Telephone Encounter (Signed)
Called [employer] mail services line and representative states that 30 day supply of Abiraterone will be $65.  [place] Patient [place] Phone [phone] Fax [phone] 02/12/2020 4:29 PM

## 2020-02-12 NOTE — Telephone Encounter (Signed)
Oral Oncology Patient Advocate Encounter  Prior Authorization for Abiraterone Fabio Asa) has been approved.    PA# [id number] Effective dates: 01/13/20 through 02/11/21  Test claim revealed copay of $887.20.  This rx may have a lower copay through [facility] - preferred pharmacy for [facility] members.  Will check copay.  Oral Oncology Clinic will continue to follow.   [place] Patient [place] Phone [phone] Fax [phone] 02/12/2020 4:19 PM

## 2020-02-13 ENCOUNTER — Telehealth (HOSPITAL_COMMUNITY): Payer: Self-pay | Admitting: Pharmacist

## 2020-02-13 DIAGNOSIS — C61 Malignant neoplasm of prostate: Secondary | ICD-10-CM

## 2020-02-13 MED ORDER — ABIRATERONE ACETATE 250 MG PO TABS: ORAL_TABLET | ORAL | 0 refills | Status: DC

## 2020-02-13 NOTE — Telephone Encounter (Signed)
Oral Oncology Pharmacist Encounter  Received new prescription for Zytiga (abiraterone) for the treatment of metastatic CRPC in conjunction with prednisone and Lupron, planned duration until disease progression or unacceptable drug toxicity.  CMP from 02/02/20 assessed, no relevant lab abnormalities. Prescription dose and frequency assessed.   Current medication list in Epic reviewed, no DDIs with abiraterone identified.   Prescription has been e-scribed to [facility].  Oral Oncology Clinic will continue to follow for initial counseling.  Darl Pikes, PharmD, BCPS, BCOP, CPP Hematology/Oncology Clinical Pharmacist Practitioner [facility] [phone]  02/13/2020 9:21 AM

## 2020-02-13 NOTE — Telephone Encounter (Signed)
Oral Chemotherapy Pharmacist Encounter  Spoke with patient's significant other Susie. Provider her with the phone number to [facility].  Patient Education I spoke with Susie for overview of new oral chemotherapy medication: Zytiga (abiraterone) for the treatment of metastatic CRPC in conjunction with prednisone and Lupron, planned duration until disease progression or unacceptable drug toxicity.   Counseled Susie on administration, dosing, side effects, monitoring, drug-food interactions, safe handling, storage, and disposal. Patient will take: -Zytiga: Take 1 tablet daily for 2 weeks, then increase to 2 tablets daily. Take on an empty stomach. Take along with prednisone 5 mg daily. -Prednisone: Take 1 tablet (5 mg total) by mouth daily with breakfast  Side effects include but not limited to: HTN, fatigue.    Reviewed with Susie importance of keeping a medication schedule and plan for any missed doses.  Susie voiced understanding and appreciation. All questions answered.  Provided Susie with Oral Chemotherapy Navigation Clinic phone number. Susie knows to call the office with questions or concerns. Oral Chemotherapy Navigation Clinic will continue to follow.  Darl Pikes, PharmD, BCPS, BCOP, CPP Hematology/Oncology Clinical Pharmacist Practitioner [facility] [phone]  02/13/2020 10:34 AM

## 2020-02-17 MED ORDER — PREDNISONE 5 MG PO TABS: 5.0000 mg | ORAL_TABLET | Freq: Every day | ORAL | 6 refills | Status: AC

## 2020-02-17 NOTE — Addendum Note (Signed)
Addended by: Darl Pikes on: 02/17/2020 04:44 PM   Modules accepted: Orders

## 2020-02-17 NOTE — Telephone Encounter (Signed)
Oral Chemotherapy Pharmacist Encounter   Prednisone sent to local pharmacy since the [place] is being filled at [facility] and not [facility].  Darl Pikes, PharmD, BCPS, BCOP, CPP Hematology/Oncology Clinical Pharmacist [facility] [phone]  02/17/2020 4:43 PM

## 2020-02-21 DIAGNOSIS — M159 Polyosteoarthritis, unspecified: Secondary | ICD-10-CM | POA: Diagnosis not present

## 2020-02-21 DIAGNOSIS — I10 Essential (primary) hypertension: Secondary | ICD-10-CM | POA: Diagnosis not present

## 2020-02-25 ENCOUNTER — Other Ambulatory Visit: Payer: Self-pay

## 2020-02-25 ENCOUNTER — Encounter (HOSPITAL_COMMUNITY): Payer: Self-pay

## 2020-02-25 ENCOUNTER — Emergency Department (HOSPITAL_COMMUNITY)
Admission: EM | Admit: 2020-02-25 | Discharge: 2020-02-25 | Disposition: A | Discharge status: Home / Self Care | Payer: Medicare Other | Attending: Emergency Medicine | Admitting: Emergency Medicine

## 2020-02-25 DIAGNOSIS — Z85830 Personal history of malignant neoplasm of bone: Secondary | ICD-10-CM | POA: Insufficient documentation

## 2020-02-25 DIAGNOSIS — K5900 Constipation, unspecified: Secondary | ICD-10-CM | POA: Diagnosis not present

## 2020-02-25 DIAGNOSIS — Z79899 Other long term (current) drug therapy: Secondary | ICD-10-CM | POA: Insufficient documentation

## 2020-02-25 DIAGNOSIS — I129 Hypertensive chronic kidney disease with stage 1 through stage 4 chronic kidney disease, or unspecified chronic kidney disease: Secondary | ICD-10-CM | POA: Diagnosis not present

## 2020-02-25 DIAGNOSIS — N189 Chronic kidney disease, unspecified: Secondary | ICD-10-CM | POA: Insufficient documentation

## 2020-02-25 DIAGNOSIS — Z8546 Personal history of malignant neoplasm of prostate: Secondary | ICD-10-CM | POA: Diagnosis not present

## 2020-02-25 DIAGNOSIS — Z950 Presence of cardiac pacemaker: Secondary | ICD-10-CM | POA: Insufficient documentation

## 2020-02-25 DIAGNOSIS — Z87891 Personal history of nicotine dependence: Secondary | ICD-10-CM | POA: Insufficient documentation

## 2020-02-25 NOTE — ED Notes (Signed)
Large BM by pt

## 2020-02-25 NOTE — ED Triage Notes (Signed)
Pt reports that he has not had a BM in 3 days. Pt has tried exlax without effectiveness.

## 2020-02-25 NOTE — ED Notes (Signed)
Susie updated about pt

## 2020-02-25 NOTE — ED Provider Notes (Signed)
[facility] EMERGENCY DEPARTMENT Provider Note   CSN: [id number] Arrival date & time: 02/25/20  1761     History Chief Complaint  Patient presents with  . Constipation  . Tachycardia    Austin Jacobs is a 86 y.o. male.  HPI     Past Medical History:  Diagnosis Date  . BPH (benign prostatic hyperplasia)   . Cancer [facility])    prostate cancer with metastasis to bone  . Chronic kidney disease   . Foley catheter in place   . Hypertension   . Presence of permanent cardiac pacemaker   . Prostate cancer [facility])     Patient Active Problem List   Diagnosis Date Noted  . 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 Bradycardia 12/15/2019  . Constipation 03/13/2019  . Normocytic anemia due to blood loss 10/12/2017  . Chest pain 05/09/2013  . HTN (hypertension) 05/09/2013  . [place] (type) I [facility]) atrioventricular block 05/09/2013    Past Surgical History:  Procedure Laterality Date  . APPENDECTOMY    . COLONOSCOPY    . ESOPHAGOGASTRODUODENOSCOPY N/A 10/24/2017   Procedure: ESOPHAGOGASTRODUODENOSCOPY (EGD);  Surgeon: Danie Binder, MD;  Location: [facility] ENDO SUITE;  Service: Endoscopy;  Laterality: N/A;  2:45pm  . HERNIA REPAIR    . PACEMAKER IMPLANT         Family History  Problem Relation Age of Onset  . Stroke Mother   . Lung cancer Father   . Colon cancer Neg Hx   . Colon polyps Neg Hx     Social History   Tobacco Use  . Smoking status: Former Smoker    Packs/day: 0.50    Years: 22.00    Pack years: 11.00    Types: Cigarettes, Pipe    Start date: 07/26/1935    Quit date: 07/25/1957    Years since quitting: 62.6  . Smokeless tobacco: Never Used  Vaping Use  . Vaping Use: Never used  Substance Use Topics  . Alcohol use: Yes    Alcohol/week: 0.0 standard drinks    Comment: Occasionally- few times a month  . Drug use: No    Home Medications Prior to Admission medications   Medication Sig Start Date End Date  Taking? Authorizing Provider  abiraterone acetate (ZYTIGA) 250 MG tablet Take 1 tablet daily for 2 weeks, then increase to 2 tablets daily. Take on an empty stomach. Take along with prednisone 5 mg daily. 02/13/20   Derek Jack, MD  aliskiren (TEKTURNA) 150 MG tablet Take 150 mg by mouth daily.    [provider]  econazole nitrate 1 % cream  01/06/20   [provider]  leuprolide (LUPRON) 30 MG injection Inject into the muscle.    [provider]  magnesium hydroxide (PHILLIPS MILK OF MAGNESIA) 311 MG CHEW chewable tablet 1 PO ONE TO THREE TIMES A DAY. Patient taking differently: Chew 311 mg by mouth 3 (three) times daily as needed.  03/13/19   Fields, Marga Melnick, MD  Multiple Vitamins-Minerals (CENTRUM SILVER PO) Take 1 tablet by mouth daily.    [provider]  pantoprazole (PROTONIX) 40 MG tablet Take 1 tablet (40 mg total) by mouth daily. Patient not taking: Reported on 02/06/2020 04/19/18   Danie Binder, MD  polyethylene glycol powder (GLYCOLAX/MIRALAX) 17 GM/SCOOP powder Take 17 g by mouth daily. 02/02/20   [provider]  predniSONE (DELTASONE) 5 MG tablet Take 1 tablet (5 mg total) by mouth daily with breakfast. 02/17/20  Derek Jack, MD  iron polysaccharides (NIFEREX) 150 MG capsule Take 1 capsule (150 mg total) by mouth daily. 03/13/19 01/12/20  Danie Binder, MD    Allergies    Patient has no known allergies.  Review of Systems   Review of Systems  Physical Exam Updated Vital Signs BP (!) 124/53 (BP Location: Right Arm)   Pulse (!) 136   Temp 98.3 F (36.8 C) (Oral)   Resp (!) 22   Ht 5\' 3"  (1.6 m)   Wt 62.1 kg   SpO2 99%   BMI 24.27 kg/m   Physical Exam  ED Results / Procedures / Treatments   Labs (all labs ordered are listed, but only abnormal results are displayed) Labs Reviewed - No data to display  EKG None  Radiology No results found.  Procedures Procedures (including critical care  time)  Medications Ordered in ED Medications - No data to display  ED Course  I have reviewed the triage vital signs and the nursing notes.  Pertinent labs & imaging results that were available during my care of the patient were reviewed by me and considered in my medical decision making (see chart for details).  Clinical Course as of Feb 24 1314  Tue Feb 25, 2020  1301 Rectal exam performed by nursing, no fecal impaction.   [EW]  1301 Enema ordered   [EW]    Clinical Course User Index [EW] Daleen Bo, MD   MDM Rules/Calculators/A&P                          Animal ordered for treatment of constipation, delayed for access to treatment room.  Disposition to Dr. Wilson Singer, following enema to reassess and determine stability for discharge.  Final Clinical Impression(s) / ED Diagnoses Final diagnoses:  Constipation, unspecified constipation type    Rx / DC Orders ED Discharge Orders    None       Daleen Bo, MD 02/26/20 [phone]

## 2020-02-25 NOTE — ED Notes (Signed)
Soap Suds completed with large results.

## 2020-02-26 ENCOUNTER — Other Ambulatory Visit (HOSPITAL_COMMUNITY): Payer: Medicare Other

## 2020-02-27 ENCOUNTER — Ambulatory Visit (HOSPITAL_COMMUNITY): Payer: Medicare Other | Admitting: Hematology

## 2020-02-27 DIAGNOSIS — Z950 Presence of cardiac pacemaker: Secondary | ICD-10-CM | POA: Diagnosis not present

## 2020-02-27 DIAGNOSIS — R001 Bradycardia, unspecified: Secondary | ICD-10-CM | POA: Diagnosis not present

## 2020-02-27 DIAGNOSIS — Z45018 Encounter for adjustment and management of other part of cardiac pacemaker: Secondary | ICD-10-CM | POA: Diagnosis not present

## 2020-02-27 DIAGNOSIS — I442 Atrioventricular block, complete: Secondary | ICD-10-CM | POA: Diagnosis not present

## 2020-02-28 ENCOUNTER — Ambulatory Visit: Payer: Medicare Other

## 2020-03-02 ENCOUNTER — Other Ambulatory Visit (HOSPITAL_COMMUNITY): Payer: Self-pay

## 2020-03-02 ENCOUNTER — Encounter (HOSPITAL_COMMUNITY): Payer: Self-pay

## 2020-03-02 ENCOUNTER — Inpatient Hospital Stay (HOSPITAL_COMMUNITY): Payer: Medicare Other

## 2020-03-02 DIAGNOSIS — C61 Malignant neoplasm of prostate: Secondary | ICD-10-CM

## 2020-03-02 DIAGNOSIS — Z192 Hormone resistant malignancy status: Secondary | ICD-10-CM

## 2020-03-02 MED ORDER — TRAMADOL HCL 50 MG PO TABS: 50.0000 mg | ORAL_TABLET | Freq: Two times a day (BID) | ORAL | 0 refills | Status: DC | PRN

## 2020-03-02 MED ORDER — LACTULOSE 20 GM/30ML PO SOLN: 30.0000 mL | Freq: Every day | ORAL | 0 refills | Status: DC

## 2020-03-02 NOTE — Progress Notes (Signed)
Patient's son called the clinic this morning stating that the patient is experiencing worsening pain, especially in his back. Son also reports that the patient has been experiencing constipation, which recently resulted in an emergency department visit. Patient's son expresses that he believes the patient would benefit from a hospice care referral. I explained the differences between hospice and palliative care. I discussed these concerns with Dr. Delton Coombes. Orders received for tramadol 50mg  Q12h and Lactulose 35mL daily in addition to the palliative care referral. I discussed new medications and referral with the son who expresses gratitude. Patient's son states that the patient is unable to come to hospital for appts. Office visit with Dr. Delton Coombes has been changed to a phone visit per family request and lab appt has been cancelled.

## 2020-03-03 ENCOUNTER — Other Ambulatory Visit: Payer: Self-pay

## 2020-03-03 ENCOUNTER — Inpatient Hospital Stay (HOSPITAL_COMMUNITY): Payer: Medicare Other | Attending: Hematology | Admitting: Hematology

## 2020-03-03 ENCOUNTER — Telehealth: Payer: Self-pay

## 2020-03-03 DIAGNOSIS — F0390 Unspecified dementia without behavioral disturbance: Secondary | ICD-10-CM | POA: Diagnosis not present

## 2020-03-03 DIAGNOSIS — C7951 Secondary malignant neoplasm of bone: Secondary | ICD-10-CM

## 2020-03-03 DIAGNOSIS — Z299 Encounter for prophylactic measures, unspecified: Secondary | ICD-10-CM | POA: Diagnosis not present

## 2020-03-03 DIAGNOSIS — C61 Malignant neoplasm of prostate: Secondary | ICD-10-CM

## 2020-03-03 DIAGNOSIS — N184 Chronic kidney disease, stage 4 (severe): Secondary | ICD-10-CM | POA: Diagnosis not present

## 2020-03-03 NOTE — Progress Notes (Signed)
Virtual Visit via Telephone Note  I connected with Austin Jacobs on 03/03/20 at 11:45 AM EDT by telephone and verified that I am speaking with the correct person using two identifiers.   I discussed the limitations, risks, security and privacy concerns of performing an evaluation and management service by telephone and the availability of in person appointments. I also discussed with the patient that there may be a patient responsible charge related to this service. The patient expressed understanding and agreed to proceed.   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  He has been receiving denosumab with Dr. Alan Ripper office.   Observations/Objective: He reportedly started on Zytiga to 50 mg daily on 02/18/2020.  1 week after starting Zytiga, he started feeling very weak with no appetite.  He also developed constipation and had to go to the ER.  He is using MiraLAX and Metamucil and suppositories.  Last bowel movement was Sunday.  He has not taken prednisone for the last couple of days, however took [place].  I talked to Susie pryor who is complaining and caretaker to the patient.  His son is also present on the phone.  Assessment and Plan:  1.  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 -Has become progressively weak with no appetite.  Also developed severe constipation. -Have told him to discontinue Zytiga at this time.  He will also stop prednisone. -He will come back in 2 weeks with repeat labs. -We have also referred for palliative services.  They will help him with changing the urinary catheter as he is unable to go to Dr. Alan Ripper office on Friday per the caregiver.  2.  Severe constipation: -He did not have a bowel movement since Sunday, 03/01/2020.  He is using MiraLAX, Metamucil and suppositories. -We have sent a prescription for  lactulose and suggested him to take 2 tablespoons every 3 hours until bowel movement followed by maintenance dose of once daily.  3.  Low back pain: -We have sent a prescription for tramadol and warned about possible side effects of constipation.   Follow Up Instructions: RTC 2 weeks with labs.  I discussed the assessment and treatment plan with the patient. The patient was provided an opportunity to ask questions and all were answered. The patient agreed with the plan and demonstrated an understanding of the instructions.   The patient was advised to call back or seek an in-person evaluation if the symptoms worsen or if the condition fails to improve as anticipated.  I provided 21 minutes of non-face-to-face time during this encounter.   Derek Jack, MD

## 2020-03-03 NOTE — Telephone Encounter (Signed)
Ms. Prior called requesting to cancel next [place] appt and to cancel all cancer shots. She states that pt will now have cath changes per home health. Pt will  start hospice care per Ms. Prior. Ms. Prior informed to call office if anything changes.

## 2020-03-04 ENCOUNTER — Other Ambulatory Visit (HOSPITAL_COMMUNITY): Payer: Self-pay

## 2020-03-04 ENCOUNTER — Encounter (HOSPITAL_COMMUNITY): Payer: Self-pay

## 2020-03-04 DIAGNOSIS — C61 Malignant neoplasm of prostate: Secondary | ICD-10-CM

## 2020-03-04 NOTE — Progress Notes (Signed)
Home health referral sent to [place]. Patient accepted by [facility] per Romualdo Bolk, [facility] RN.

## 2020-03-04 NOTE — Progress Notes (Signed)
Orders placed for home health per Dr. Delton Coombes.

## 2020-03-06 ENCOUNTER — Ambulatory Visit: Payer: Medicare Other

## 2020-03-06 ENCOUNTER — Other Ambulatory Visit (HOSPITAL_COMMUNITY): Payer: Self-pay | Admitting: Hematology

## 2020-03-06 DIAGNOSIS — Z466 Encounter for fitting and adjustment of urinary device: Secondary | ICD-10-CM | POA: Diagnosis not present

## 2020-03-06 DIAGNOSIS — C61 Malignant neoplasm of prostate: Secondary | ICD-10-CM

## 2020-03-06 DIAGNOSIS — C7951 Secondary malignant neoplasm of bone: Secondary | ICD-10-CM | POA: Diagnosis not present

## 2020-03-06 DIAGNOSIS — N400 Enlarged prostate without lower urinary tract symptoms: Secondary | ICD-10-CM | POA: Diagnosis not present

## 2020-03-06 DIAGNOSIS — K5900 Constipation, unspecified: Secondary | ICD-10-CM | POA: Diagnosis not present

## 2020-03-06 DIAGNOSIS — N189 Chronic kidney disease, unspecified: Secondary | ICD-10-CM | POA: Diagnosis not present

## 2020-03-06 DIAGNOSIS — I129 Hypertensive chronic kidney disease with stage 1 through stage 4 chronic kidney disease, or unspecified chronic kidney disease: Secondary | ICD-10-CM | POA: Diagnosis not present

## 2020-03-06 DIAGNOSIS — D649 Anemia, unspecified: Secondary | ICD-10-CM | POA: Diagnosis not present

## 2020-03-06 DIAGNOSIS — Z79891 Long term (current) use of opiate analgesic: Secondary | ICD-10-CM | POA: Diagnosis not present

## 2020-03-06 DIAGNOSIS — Z192 Hormone resistant malignancy status: Secondary | ICD-10-CM

## 2020-03-06 DIAGNOSIS — Z87891 Personal history of nicotine dependence: Secondary | ICD-10-CM | POA: Diagnosis not present

## 2020-03-06 DIAGNOSIS — M545 Low back pain: Secondary | ICD-10-CM | POA: Diagnosis not present

## 2020-03-06 DIAGNOSIS — Z950 Presence of cardiac pacemaker: Secondary | ICD-10-CM | POA: Diagnosis not present

## 2020-03-09 ENCOUNTER — Other Ambulatory Visit (HOSPITAL_COMMUNITY): Payer: Self-pay

## 2020-03-09 DIAGNOSIS — C61 Malignant neoplasm of prostate: Secondary | ICD-10-CM | POA: Diagnosis not present

## 2020-03-09 DIAGNOSIS — Z466 Encounter for fitting and adjustment of urinary device: Secondary | ICD-10-CM | POA: Diagnosis not present

## 2020-03-09 DIAGNOSIS — I129 Hypertensive chronic kidney disease with stage 1 through stage 4 chronic kidney disease, or unspecified chronic kidney disease: Secondary | ICD-10-CM | POA: Diagnosis not present

## 2020-03-09 DIAGNOSIS — C7951 Secondary malignant neoplasm of bone: Secondary | ICD-10-CM | POA: Diagnosis not present

## 2020-03-09 DIAGNOSIS — N189 Chronic kidney disease, unspecified: Secondary | ICD-10-CM | POA: Diagnosis not present

## 2020-03-09 DIAGNOSIS — N400 Enlarged prostate without lower urinary tract symptoms: Secondary | ICD-10-CM | POA: Diagnosis not present

## 2020-03-09 MED ORDER — LACTULOSE 20 GM/30ML PO SOLN: 30.0000 mL | Freq: Every day | ORAL | 2 refills | Status: AC

## 2020-03-11 DIAGNOSIS — N189 Chronic kidney disease, unspecified: Secondary | ICD-10-CM | POA: Diagnosis not present

## 2020-03-11 DIAGNOSIS — N400 Enlarged prostate without lower urinary tract symptoms: Secondary | ICD-10-CM | POA: Diagnosis not present

## 2020-03-11 DIAGNOSIS — I129 Hypertensive chronic kidney disease with stage 1 through stage 4 chronic kidney disease, or unspecified chronic kidney disease: Secondary | ICD-10-CM | POA: Diagnosis not present

## 2020-03-11 DIAGNOSIS — C61 Malignant neoplasm of prostate: Secondary | ICD-10-CM | POA: Diagnosis not present

## 2020-03-11 DIAGNOSIS — C7951 Secondary malignant neoplasm of bone: Secondary | ICD-10-CM | POA: Diagnosis not present

## 2020-03-11 DIAGNOSIS — Z466 Encounter for fitting and adjustment of urinary device: Secondary | ICD-10-CM | POA: Diagnosis not present

## 2020-03-12 DIAGNOSIS — Z466 Encounter for fitting and adjustment of urinary device: Secondary | ICD-10-CM | POA: Diagnosis not present

## 2020-03-12 DIAGNOSIS — N189 Chronic kidney disease, unspecified: Secondary | ICD-10-CM | POA: Diagnosis not present

## 2020-03-12 DIAGNOSIS — N400 Enlarged prostate without lower urinary tract symptoms: Secondary | ICD-10-CM | POA: Diagnosis not present

## 2020-03-12 DIAGNOSIS — C61 Malignant neoplasm of prostate: Secondary | ICD-10-CM | POA: Diagnosis not present

## 2020-03-12 DIAGNOSIS — I129 Hypertensive chronic kidney disease with stage 1 through stage 4 chronic kidney disease, or unspecified chronic kidney disease: Secondary | ICD-10-CM | POA: Diagnosis not present

## 2020-03-12 DIAGNOSIS — I10 Essential (primary) hypertension: Secondary | ICD-10-CM | POA: Diagnosis not present

## 2020-03-12 DIAGNOSIS — M159 Polyosteoarthritis, unspecified: Secondary | ICD-10-CM | POA: Diagnosis not present

## 2020-03-12 DIAGNOSIS — C7951 Secondary malignant neoplasm of bone: Secondary | ICD-10-CM | POA: Diagnosis not present

## 2020-03-13 DIAGNOSIS — Z466 Encounter for fitting and adjustment of urinary device: Secondary | ICD-10-CM | POA: Diagnosis not present

## 2020-03-13 DIAGNOSIS — C7951 Secondary malignant neoplasm of bone: Secondary | ICD-10-CM | POA: Diagnosis not present

## 2020-03-13 DIAGNOSIS — C61 Malignant neoplasm of prostate: Secondary | ICD-10-CM | POA: Diagnosis not present

## 2020-03-13 DIAGNOSIS — N189 Chronic kidney disease, unspecified: Secondary | ICD-10-CM | POA: Diagnosis not present

## 2020-03-13 DIAGNOSIS — I129 Hypertensive chronic kidney disease with stage 1 through stage 4 chronic kidney disease, or unspecified chronic kidney disease: Secondary | ICD-10-CM | POA: Diagnosis not present

## 2020-03-13 DIAGNOSIS — N400 Enlarged prostate without lower urinary tract symptoms: Secondary | ICD-10-CM | POA: Diagnosis not present

## 2020-03-16 ENCOUNTER — Inpatient Hospital Stay (HOSPITAL_COMMUNITY): Payer: Medicare Other | Attending: Hematology | Admitting: Hematology

## 2020-03-16 ENCOUNTER — Inpatient Hospital Stay (HOSPITAL_COMMUNITY): Payer: Medicare Other

## 2020-03-16 DIAGNOSIS — C61 Malignant neoplasm of prostate: Secondary | ICD-10-CM

## 2020-03-16 DIAGNOSIS — Z192 Hormone resistant malignancy status: Secondary | ICD-10-CM

## 2020-03-16 MED ORDER — TRAMADOL HCL 50 MG PO TABS
25.0000 mg | ORAL_TABLET | Freq: Two times a day (BID) | ORAL | 0 refills | Status: AC | PRN
Start: 2020-03-16 — End: ?

## 2020-03-16 MED ORDER — DRONABINOL 2.5 MG PO CAPS
2.5000 mg | ORAL_CAPSULE | Freq: Two times a day (BID) | ORAL | 1 refills | Status: AC
Start: 2020-03-16 — End: ?

## 2020-03-16 NOTE — Progress Notes (Signed)
Virtual Visit via Telephone Note  I connected with Austin Jacobs on 03/16/20 at  9:15 AM EDT by telephone and verified that I am speaking with the correct person using two identifiers.   I discussed the limitations, risks, security and privacy concerns of performing an evaluation and management service by telephone and the availability of in person appointments. I also discussed with the patient that there may be a patient responsible charge related to this service. The patient expressed understanding and agreed to proceed.   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  He was referred to our clinic by Dr. Diona Fanti.  Zytiga 250 mg daily started on 02/18/2020, discontinued on 03/03/2020 secondary to tiredness.   Observations/Objective: I spoke to the patient's son over the phone as patient is lying in his bed.  Reportedly he is lying in the bed most of the time during the day.  He was evaluated by home health and physical therapy was started last week, 2 sessions took place.  His constipation is better with lactulose daily.  His son reports that his pain is well controlled with tramadol 50 mg twice daily.  Foley catheter was apparently changed by home health.  Assessment and Plan:  1.  Metastatic CRPC to the bones: -Zytiga 250mg  from 02/18/2020 through 03/03/2020. -Discontinued secondary to weakness. -However his weakness has not improved any. -He started physical therapy last week, 2 sessions.  We will continue physical therapy at this time.  I will reevaluate him in 2 weeks.  If there is no improvement, will consider palliative services help. -His son is in agreement. -We will obtain and review labs drawn by home health last week.  2.  Severe constipation: -Continue lactulose 30 mL daily which is helping.  3.  Low back pain: -Currently taking tramadol 50 mg twice daily. -I will cut back on tramadol to 25  mg twice daily to see if it helps with improvement in fatigue.  4.  Loss of appetite: -We will start him on Marinol 2.5 mg twice daily.  Addendum: -I have reviewed labs from home health.  Creatinine is 2.1 at baseline.  Albumin is low at 3.0.  Otherwise no major changes.  Follow Up Instructions: Phone visit in 2 weeks.   I discussed the assessment and treatment plan with the patient. The patient was provided an opportunity to ask questions and all were answered. The patient agreed with the plan and demonstrated an understanding of the instructions.   The patient was advised to call back or seek an in-person evaluation if the symptoms worsen or if the condition fails to improve as anticipated.  I provided 21 minutes of non-face-to-face time during this encounter.   Derek Jack, MD

## 2020-03-17 DIAGNOSIS — N400 Enlarged prostate without lower urinary tract symptoms: Secondary | ICD-10-CM | POA: Diagnosis not present

## 2020-03-17 DIAGNOSIS — I129 Hypertensive chronic kidney disease with stage 1 through stage 4 chronic kidney disease, or unspecified chronic kidney disease: Secondary | ICD-10-CM | POA: Diagnosis not present

## 2020-03-17 DIAGNOSIS — N189 Chronic kidney disease, unspecified: Secondary | ICD-10-CM | POA: Diagnosis not present

## 2020-03-17 DIAGNOSIS — R54 Age-related physical debility: Secondary | ICD-10-CM | POA: Diagnosis not present

## 2020-03-17 DIAGNOSIS — Z466 Encounter for fitting and adjustment of urinary device: Secondary | ICD-10-CM | POA: Diagnosis not present

## 2020-03-17 DIAGNOSIS — C61 Malignant neoplasm of prostate: Secondary | ICD-10-CM | POA: Diagnosis not present

## 2020-03-17 DIAGNOSIS — Z515 Encounter for palliative care: Secondary | ICD-10-CM | POA: Diagnosis not present

## 2020-03-17 DIAGNOSIS — C7951 Secondary malignant neoplasm of bone: Secondary | ICD-10-CM | POA: Diagnosis not present

## 2020-03-19 ENCOUNTER — Encounter (HOSPITAL_COMMUNITY): Payer: Self-pay

## 2020-03-19 DIAGNOSIS — N189 Chronic kidney disease, unspecified: Secondary | ICD-10-CM | POA: Diagnosis not present

## 2020-03-19 DIAGNOSIS — C61 Malignant neoplasm of prostate: Secondary | ICD-10-CM | POA: Diagnosis not present

## 2020-03-19 DIAGNOSIS — C7951 Secondary malignant neoplasm of bone: Secondary | ICD-10-CM | POA: Diagnosis not present

## 2020-03-19 DIAGNOSIS — Z466 Encounter for fitting and adjustment of urinary device: Secondary | ICD-10-CM | POA: Diagnosis not present

## 2020-03-19 DIAGNOSIS — N400 Enlarged prostate without lower urinary tract symptoms: Secondary | ICD-10-CM | POA: Diagnosis not present

## 2020-03-19 DIAGNOSIS — I129 Hypertensive chronic kidney disease with stage 1 through stage 4 chronic kidney disease, or unspecified chronic kidney disease: Secondary | ICD-10-CM | POA: Diagnosis not present

## 2020-03-19 NOTE — Progress Notes (Signed)
Patient's significant other, Susie, who states that the patient has had a rapid decline. Susie reports extreme fatigue, inability to participate in physical therapy with home health and is now not eating or drinking. Susie states that she and the home health RN discussed potential dehydration. Susie also reports some confusion, stating it is very similar to previous confusion associated with UTI. Susie states that an office visit is not an option as she cannot safely transport the patient. Susie reports that she and family are discussing transporting patient to the ED via EMS.

## 2020-03-20 DIAGNOSIS — C7951 Secondary malignant neoplasm of bone: Secondary | ICD-10-CM | POA: Diagnosis not present

## 2020-03-20 DIAGNOSIS — I10 Essential (primary) hypertension: Secondary | ICD-10-CM | POA: Diagnosis not present

## 2020-03-20 DIAGNOSIS — N189 Chronic kidney disease, unspecified: Secondary | ICD-10-CM | POA: Diagnosis not present

## 2020-03-20 DIAGNOSIS — I959 Hypotension, unspecified: Secondary | ICD-10-CM | POA: Diagnosis not present

## 2020-03-20 DIAGNOSIS — Z299 Encounter for prophylactic measures, unspecified: Secondary | ICD-10-CM | POA: Diagnosis not present

## 2020-03-20 DIAGNOSIS — I129 Hypertensive chronic kidney disease with stage 1 through stage 4 chronic kidney disease, or unspecified chronic kidney disease: Secondary | ICD-10-CM | POA: Diagnosis not present

## 2020-03-20 DIAGNOSIS — E860 Dehydration: Secondary | ICD-10-CM | POA: Diagnosis not present

## 2020-03-20 DIAGNOSIS — R531 Weakness: Secondary | ICD-10-CM | POA: Diagnosis not present

## 2020-03-20 DIAGNOSIS — N400 Enlarged prostate without lower urinary tract symptoms: Secondary | ICD-10-CM | POA: Diagnosis not present

## 2020-03-20 DIAGNOSIS — Z515 Encounter for palliative care: Secondary | ICD-10-CM | POA: Diagnosis not present

## 2020-03-20 DIAGNOSIS — C61 Malignant neoplasm of prostate: Secondary | ICD-10-CM | POA: Diagnosis not present

## 2020-03-20 DIAGNOSIS — Z466 Encounter for fitting and adjustment of urinary device: Secondary | ICD-10-CM | POA: Diagnosis not present

## 2020-03-20 DIAGNOSIS — N184 Chronic kidney disease, stage 4 (severe): Secondary | ICD-10-CM | POA: Diagnosis not present

## 2020-03-21 DIAGNOSIS — I129 Hypertensive chronic kidney disease with stage 1 through stage 4 chronic kidney disease, or unspecified chronic kidney disease: Secondary | ICD-10-CM | POA: Diagnosis not present

## 2020-03-21 DIAGNOSIS — N400 Enlarged prostate without lower urinary tract symptoms: Secondary | ICD-10-CM | POA: Diagnosis not present

## 2020-03-21 DIAGNOSIS — N189 Chronic kidney disease, unspecified: Secondary | ICD-10-CM | POA: Diagnosis not present

## 2020-03-21 DIAGNOSIS — C7951 Secondary malignant neoplasm of bone: Secondary | ICD-10-CM | POA: Diagnosis not present

## 2020-03-21 DIAGNOSIS — Z466 Encounter for fitting and adjustment of urinary device: Secondary | ICD-10-CM | POA: Diagnosis not present

## 2020-03-21 DIAGNOSIS — C61 Malignant neoplasm of prostate: Secondary | ICD-10-CM | POA: Diagnosis not present

## 2020-03-23 ENCOUNTER — Other Ambulatory Visit (HOSPITAL_COMMUNITY): Payer: Self-pay

## 2020-03-23 DIAGNOSIS — Z192 Hormone resistant malignancy status: Secondary | ICD-10-CM

## 2020-03-23 DIAGNOSIS — C61 Malignant neoplasm of prostate: Secondary | ICD-10-CM

## 2020-03-25 NOTE — Progress Notes (Signed)
Call received from [place], [place] with [facility] stating that the patient is now unresponsive and is requesting Hospice referral. Dr. Delton Coombes aware and order for referral to hospice placed per verbal order.

## 2020-03-25 DEATH — deceased

## 2020-03-27 ENCOUNTER — Ambulatory Visit: Payer: Medicare Other

## 2020-03-31 ENCOUNTER — Telehealth (HOSPITAL_COMMUNITY): Payer: Medicare Other | Admitting: Hematology

## 2020-04-03 ENCOUNTER — Ambulatory Visit: Payer: Medicare Other

## 2020-04-24 ENCOUNTER — Ambulatory Visit: Payer: Medicare Other

## 2020-05-01 ENCOUNTER — Ambulatory Visit: Payer: Medicare Other

## 2020-12-26 IMAGING — CT CT ABD-PELV W/O CM
2 of 4 series · 16 of 46 positions shown, 18 images · non-contrast
Comparison: 04/25/2019 and 10/15/2015

CLINICAL DATA: Abdominal distension and constipation. Possible
obstructing abdominal mass. Chronic kidney disease.

EXAM:
CT ABDOMEN AND PELVIS WITHOUT CONTRAST
TECHNIQUE: Multidetector CT imaging of the abdomen and pelvis was performed
following the standard protocol without IV contrast.

[Series 2: axial st · axial · 0.81mm/px · z∈[-706,-286]mm · 13 of 94 slices shown, 15 images]
[im 5/94  soft-tissue]
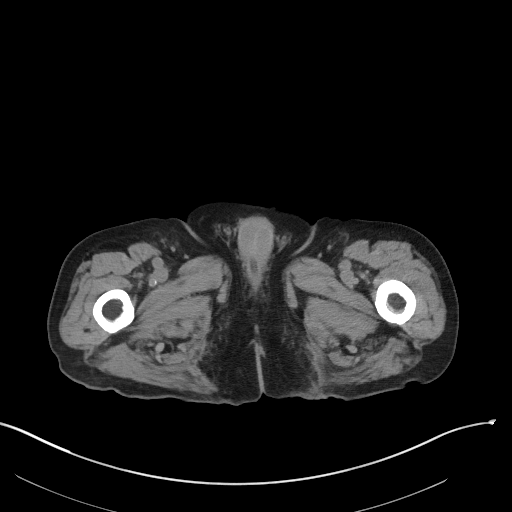
[im 5/94  bone]
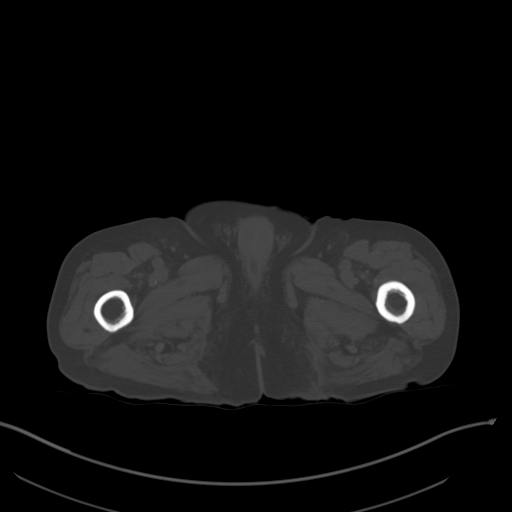
[im 15/94  soft-tissue]
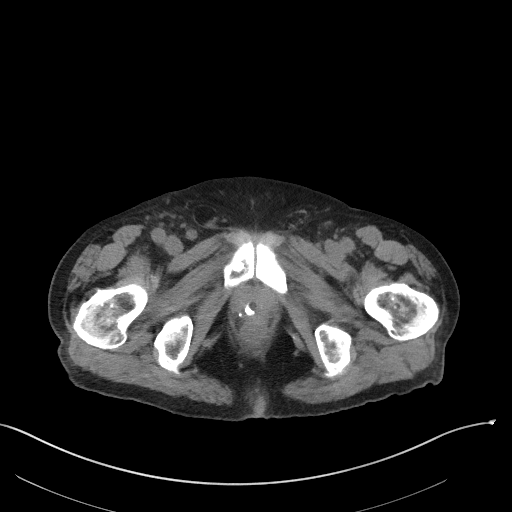
[im 20/94  soft-tissue]
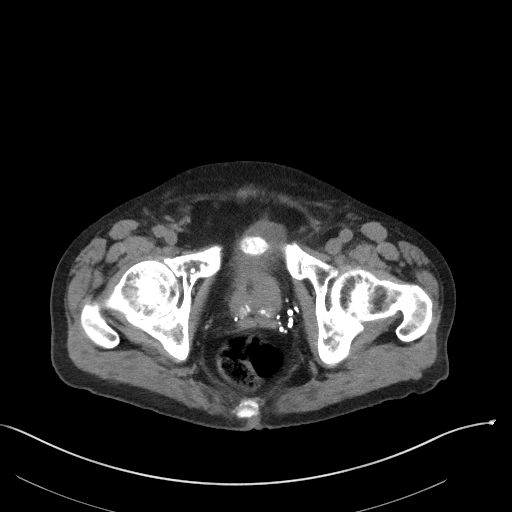
[im 25/94  soft-tissue]
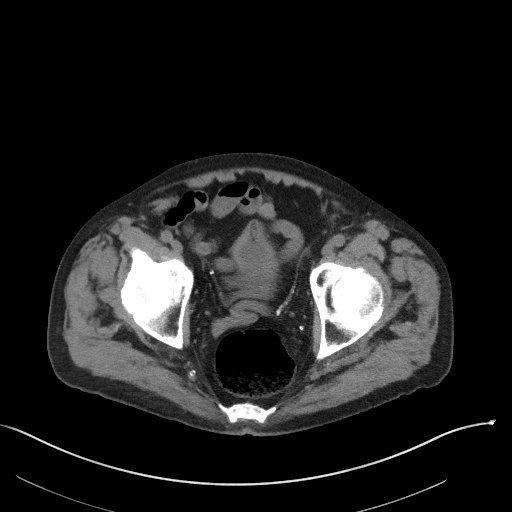
[im 35/94  soft-tissue]
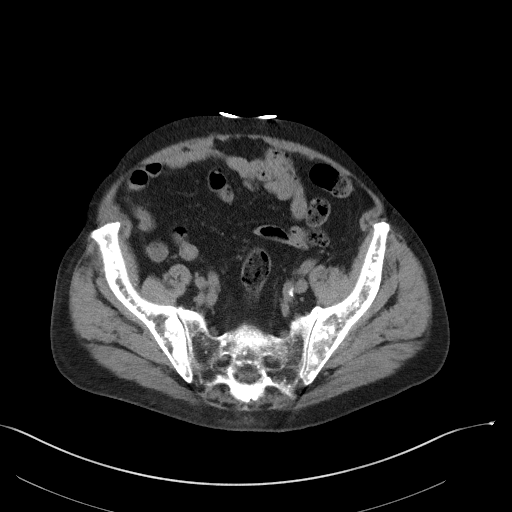
[im 40/94  soft-tissue]
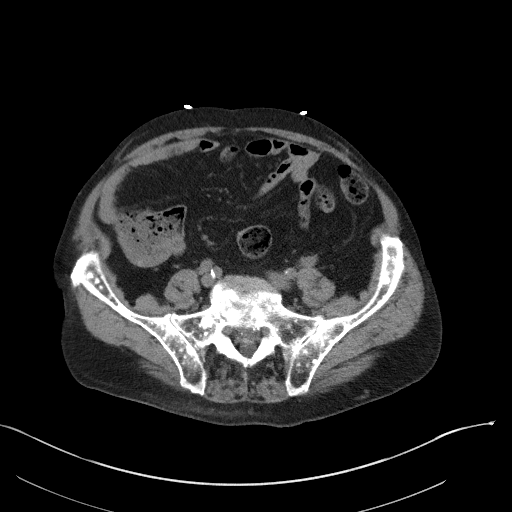
[im 49/94  soft-tissue]
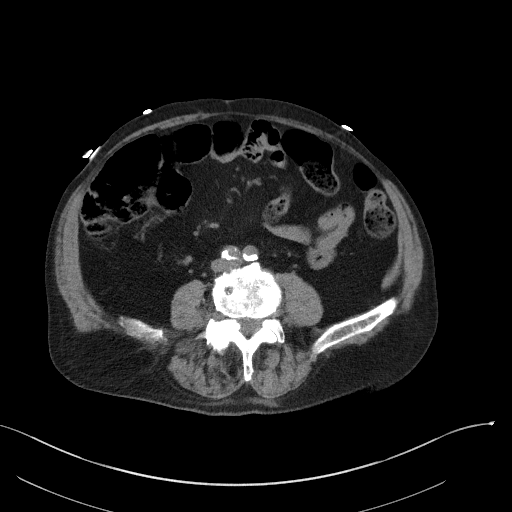
[im 54/94  soft-tissue]
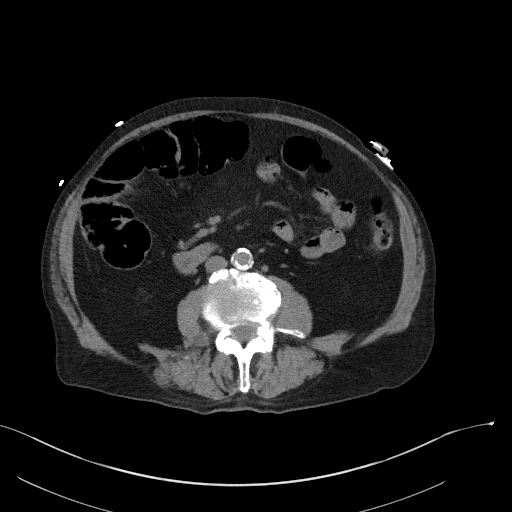
[im 59/94  soft-tissue]
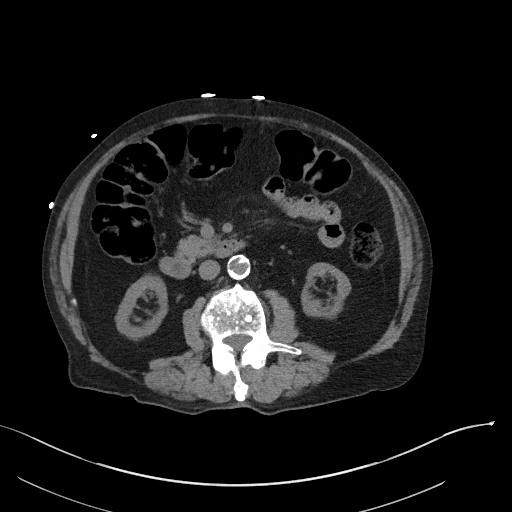
[im 59/94  bone]
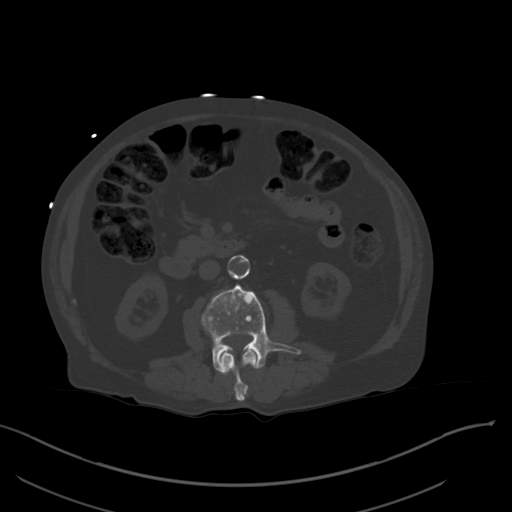
[im 69/94  soft-tissue]
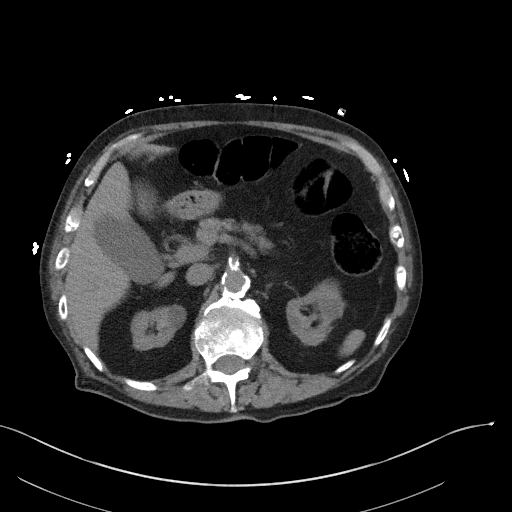
[im 74/94  soft-tissue]
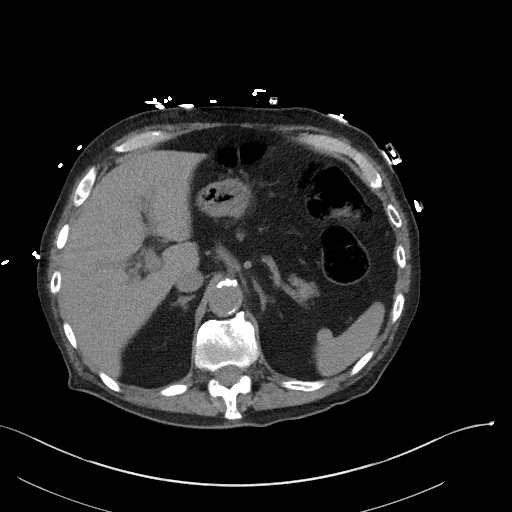
[im 79/94  soft-tissue]
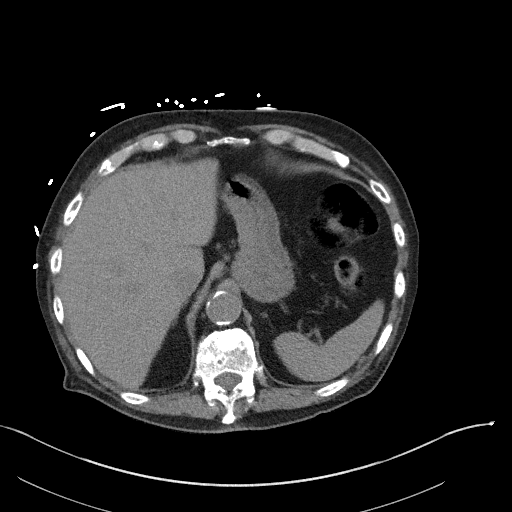
[im 89/94  soft-tissue]
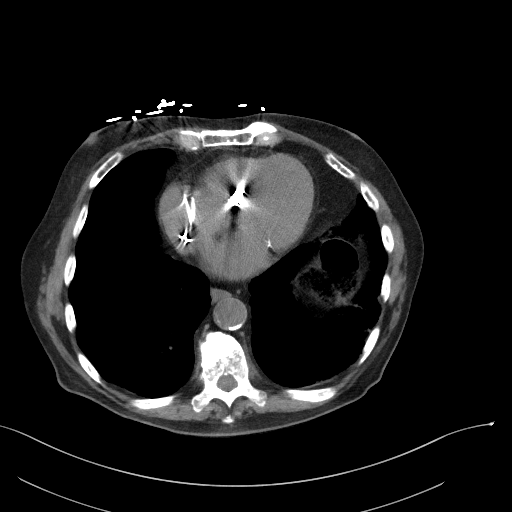

[Series 5: coronal st · coronal · 0.80mm/px · 3 of 104 slices shown]
[im 35/104  soft-tissue]
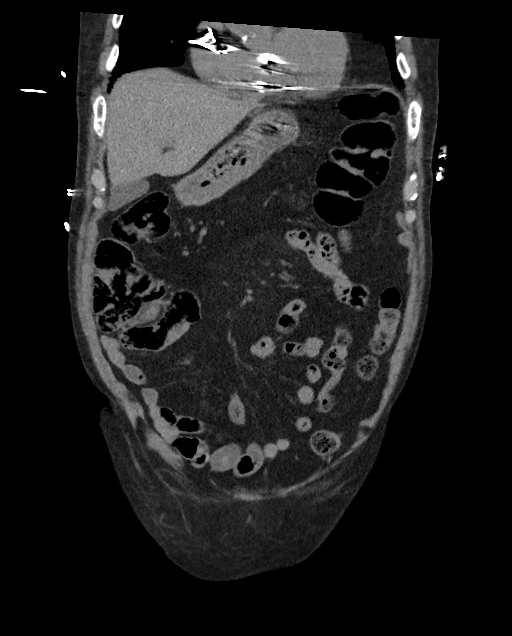
[im 46/104  soft-tissue]
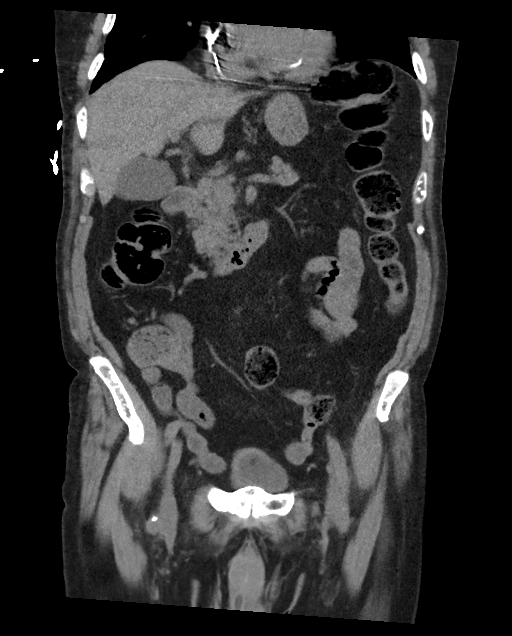
[im 58/104  soft-tissue]
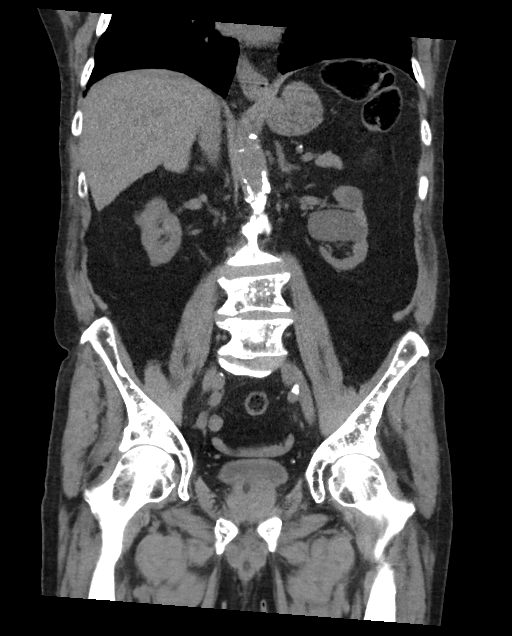

[16 of 46 positions shown; findings below may reference images not displayed]

FINDINGS: Lower chest: Calcified granuloma over the left lower lobe and right
middle lobe. Noncalcified subpleural 3 mm nodule over the posterior
right lower lobe unchanged. Mild subpleural interstitial thickening
over the lung bases unchanged. Calcified plaque over the descending
thoracic aorta. Calcified plaque over the left lateral circumflex
coronary artery. Cardiac pacer leads are present.

Hepatobiliary: Liver, gallbladder and biliary tree are normal.

Pancreas: Normal.

Spleen: Normal.

Adrenals/Urinary Tract: Adrenal glands are normal. Kidneys at the
lower limits of normal in size without hydronephrosis. 3 mm stone
over the upper pole right kidney unchanged. Bilateral renal cysts
with the larger measuring 4.6 cm over the left mid to lower
pararenal region unchanged. Ureters are normal. Foley catheter is
present within the bladder.

Stomach/Bowel: Stomach and small bowel are normal. Previous
appendectomy. Colon is normal with mild fecal retention.

Vascular/Lymphatic: Moderate calcified plaque over the abdominal
aorta which is normal in caliber. Significant calcified plaque at
the origin/proximal right renal artery. No adenopathy.

Reproductive: Normal.

Other: No free fluid or focal inflammatory change.

Musculoskeletal: Numerous sclerotic foci throughout the bony
structures with interval progression compatible patient's known
metastatic prostate cancer.
IMPRESSION: 1. No acute findings in the abdomen/pelvis. No evidence of bowel
obstruction.

2. Bilateral renal cysts unchanged. Nonobstructing 3 mm right renal
stone unchanged.

3. Aortic Atherosclerosis ([id number]). Atherosclerotic coronary
artery disease.

4. Diffuse sclerotic foci throughout the bony structures with
interval progression compatible with known metastatic prostate
cancer.

## 2021-01-16 IMAGING — DX DG CHEST 1V PORT
1 series · 1 of 1 positions shown · non-contrast
Comparison: 12/15/2019

CLINICAL DATA: Increasing fatigue after pacemaker replacement
12/16/2019

EXAM:
PORTABLE CHEST 1 VIEW

[chest ap]
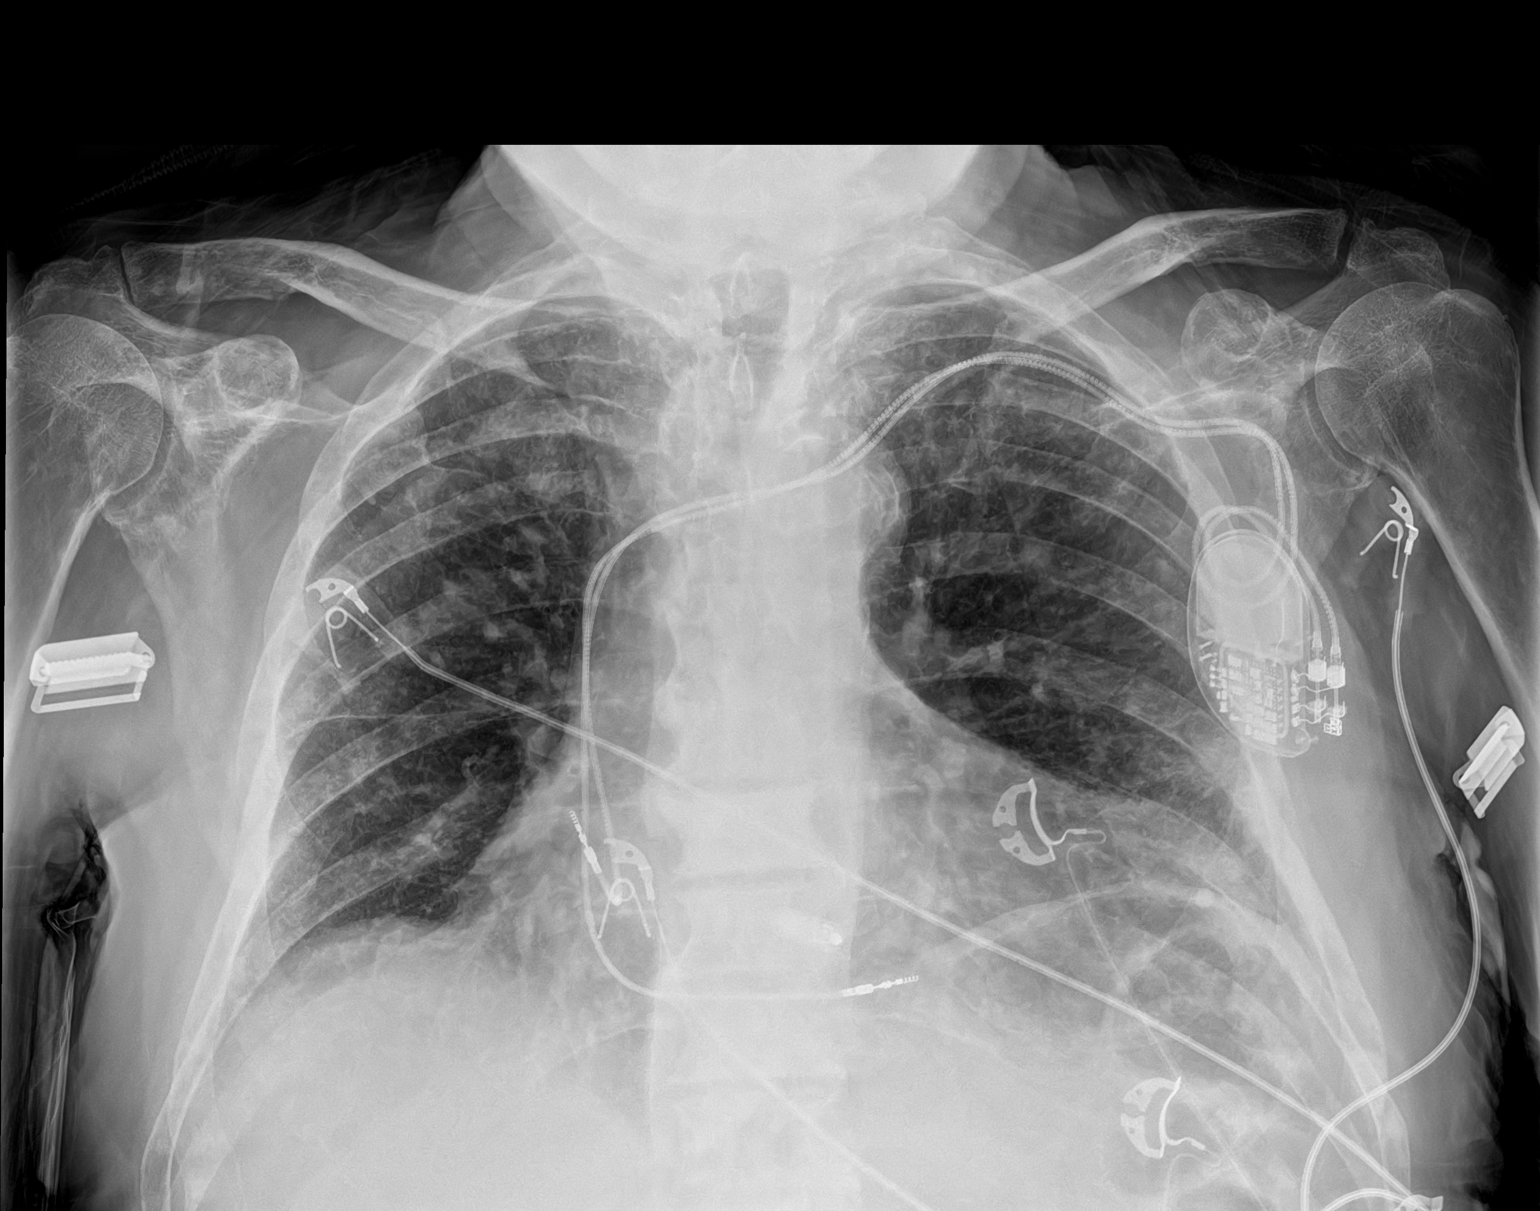

[1 of 1 positions shown; findings below may reference images not displayed]

FINDINGS: Single frontal view of the chest demonstrates pacemaker generator
overlying left chest, proximal lead overlies right atrium and distal
lead overlies right ventricle. Cardiac silhouette is enlarged but
stable. Chronic interstitial scarring without airspace disease,
effusion, or pneumothorax. No acute bony abnormalities.
IMPRESSION: 1. No acute intrathoracic process.
